# Patient Record
Sex: Male | Born: 1955
Health system: Southern US, Community
[De-identification: ages and names within clinical notes are randomized; demographics above are authoritative.]

## PROBLEM LIST (undated history)

## (undated) DIAGNOSIS — T7840XA Allergy, unspecified, initial encounter: Secondary | ICD-10-CM

## (undated) DIAGNOSIS — I10 Essential (primary) hypertension: Secondary | ICD-10-CM

## (undated) DIAGNOSIS — E785 Hyperlipidemia, unspecified: Secondary | ICD-10-CM

## (undated) DIAGNOSIS — E669 Obesity, unspecified: Secondary | ICD-10-CM

## (undated) HISTORY — DX: Hyperlipidemia, unspecified: E78.5

## (undated) HISTORY — DX: Essential (primary) hypertension: I10

## (undated) HISTORY — DX: Allergy, unspecified, initial encounter: T78.40XA

## (undated) HISTORY — DX: Obesity, unspecified: E66.9

---
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
## 1984-02-16 HISTORY — PX: CHOLECYSTECTOMY: SHX55

## 1984-02-16 HISTORY — PX: INGUINAL HERNIA REPAIR: SUR1180

## 1986-02-15 HISTORY — PX: INGUINAL HERNIA REPAIR: SUR1180

## 2001-08-14 ENCOUNTER — Encounter: Admission: RE | Admit: 2001-08-14 | Discharge: 2001-11-12 | Payer: Self-pay | Admitting: Family Medicine

## 2005-09-02 ENCOUNTER — Ambulatory Visit: Payer: Self-pay | Admitting: Family Medicine

## 2005-09-28 ENCOUNTER — Ambulatory Visit: Payer: Self-pay | Admitting: Gastroenterology

## 2005-11-18 ENCOUNTER — Ambulatory Visit: Payer: Self-pay | Admitting: Gastroenterology

## 2005-11-29 ENCOUNTER — Ambulatory Visit: Payer: Self-pay | Admitting: Family Medicine

## 2005-12-10 ENCOUNTER — Ambulatory Visit: Payer: Self-pay | Admitting: Family Medicine

## 2006-04-01 ENCOUNTER — Ambulatory Visit: Payer: Self-pay | Admitting: Family Medicine

## 2006-04-08 ENCOUNTER — Ambulatory Visit: Payer: Self-pay | Admitting: Family Medicine

## 2006-08-25 ENCOUNTER — Ambulatory Visit: Payer: Self-pay | Admitting: Family Medicine

## 2006-08-31 ENCOUNTER — Ambulatory Visit: Payer: Self-pay | Admitting: Family Medicine

## 2006-12-02 ENCOUNTER — Ambulatory Visit: Payer: Self-pay | Admitting: Family Medicine

## 2007-03-06 ENCOUNTER — Ambulatory Visit: Payer: Self-pay | Admitting: Family Medicine

## 2007-06-02 ENCOUNTER — Ambulatory Visit: Payer: Self-pay | Admitting: Family Medicine

## 2007-07-03 ENCOUNTER — Ambulatory Visit: Payer: Self-pay | Admitting: Family Medicine

## 2007-07-24 ENCOUNTER — Ambulatory Visit: Payer: Self-pay | Admitting: Family Medicine

## 2007-11-03 ENCOUNTER — Ambulatory Visit: Payer: Self-pay | Admitting: Family Medicine

## 2007-12-01 ENCOUNTER — Ambulatory Visit: Payer: Self-pay | Admitting: Family Medicine

## 2008-02-23 ENCOUNTER — Ambulatory Visit: Payer: Self-pay | Admitting: Family Medicine

## 2008-07-23 ENCOUNTER — Ambulatory Visit: Payer: Self-pay | Admitting: Family Medicine

## 2008-12-02 ENCOUNTER — Ambulatory Visit: Payer: Self-pay | Admitting: Family Medicine

## 2009-04-28 ENCOUNTER — Ambulatory Visit: Payer: Self-pay | Admitting: Family Medicine

## 2009-09-24 ENCOUNTER — Ambulatory Visit: Payer: Self-pay | Admitting: Family Medicine

## 2009-11-11 ENCOUNTER — Ambulatory Visit: Payer: Self-pay | Admitting: Family Medicine

## 2009-11-15 HISTORY — PX: EYE SURGERY: SHX253

## 2010-02-10 ENCOUNTER — Ambulatory Visit: Payer: Self-pay | Admitting: Family Medicine

## 2010-05-19 ENCOUNTER — Ambulatory Visit (INDEPENDENT_AMBULATORY_CARE_PROVIDER_SITE_OTHER): Payer: BC Managed Care – PPO | Admitting: Family Medicine

## 2010-05-19 DIAGNOSIS — E119 Type 2 diabetes mellitus without complications: Secondary | ICD-10-CM

## 2010-05-19 DIAGNOSIS — I10 Essential (primary) hypertension: Secondary | ICD-10-CM

## 2010-05-19 DIAGNOSIS — E785 Hyperlipidemia, unspecified: Secondary | ICD-10-CM

## 2010-08-04 ENCOUNTER — Other Ambulatory Visit: Payer: Self-pay | Admitting: Family Medicine

## 2010-09-17 ENCOUNTER — Encounter: Payer: Self-pay | Admitting: Family Medicine

## 2010-09-21 ENCOUNTER — Ambulatory Visit (INDEPENDENT_AMBULATORY_CARE_PROVIDER_SITE_OTHER): Payer: BC Managed Care – PPO | Admitting: Family Medicine

## 2010-09-21 ENCOUNTER — Encounter: Payer: Self-pay | Admitting: Family Medicine

## 2010-09-21 DIAGNOSIS — E785 Hyperlipidemia, unspecified: Secondary | ICD-10-CM

## 2010-09-21 DIAGNOSIS — I152 Hypertension secondary to endocrine disorders: Secondary | ICD-10-CM | POA: Insufficient documentation

## 2010-09-21 DIAGNOSIS — E1159 Type 2 diabetes mellitus with other circulatory complications: Secondary | ICD-10-CM | POA: Insufficient documentation

## 2010-09-21 DIAGNOSIS — E1169 Type 2 diabetes mellitus with other specified complication: Secondary | ICD-10-CM

## 2010-09-21 DIAGNOSIS — I10 Essential (primary) hypertension: Secondary | ICD-10-CM

## 2010-09-21 DIAGNOSIS — E118 Type 2 diabetes mellitus with unspecified complications: Secondary | ICD-10-CM | POA: Insufficient documentation

## 2010-09-21 DIAGNOSIS — E669 Obesity, unspecified: Secondary | ICD-10-CM

## 2010-09-21 DIAGNOSIS — E119 Type 2 diabetes mellitus without complications: Secondary | ICD-10-CM

## 2010-09-21 NOTE — Patient Instructions (Signed)
Standing her present medicines and make sure you followup with her ophthalmologist. See you in 4 months.

## 2010-09-21 NOTE — Progress Notes (Signed)
  Subjective:    Patient ID: Paul Long, male    DOB: [birth year], 55 y.o.   MRN: [id number]  HPI He is here for recheck. He continues on medications listed in the chart. He checks his sugars, exercises. He does not smoke or drink. He did have cataract surgery and is scheduled to see his ophthalmologist within the next several months. He does check his feet regularly. Work continues to go well. Home life is unchanged   Review of Systems     Objective:   Physical Exam Dirt and in no distress. Hemoglobin A1c 6.4       Assessment & Plan:  Diabetes. Hypertension. Hyperlipidemia. Obesity Continue present medication regimen. Followup with ophthalmologist

## 2010-10-01 ENCOUNTER — Other Ambulatory Visit: Payer: Self-pay | Admitting: Family Medicine

## 2010-10-08 ENCOUNTER — Other Ambulatory Visit: Payer: Self-pay | Admitting: Family Medicine

## 2010-11-12 ENCOUNTER — Other Ambulatory Visit: Payer: Self-pay | Admitting: Family Medicine

## 2010-11-16 HISTORY — PX: EYE SURGERY: SHX253

## 2010-11-27 ENCOUNTER — Other Ambulatory Visit: Payer: Self-pay | Admitting: Family Medicine

## 2010-11-30 ENCOUNTER — Other Ambulatory Visit (INDEPENDENT_AMBULATORY_CARE_PROVIDER_SITE_OTHER): Payer: BC Managed Care – PPO

## 2010-11-30 DIAGNOSIS — Z23 Encounter for immunization: Secondary | ICD-10-CM

## 2011-02-11 ENCOUNTER — Ambulatory Visit (INDEPENDENT_AMBULATORY_CARE_PROVIDER_SITE_OTHER): Payer: BC Managed Care – PPO | Admitting: Family Medicine

## 2011-02-11 ENCOUNTER — Encounter: Payer: Self-pay | Admitting: Family Medicine

## 2011-02-11 VITALS — BP 110/70 | HR 95 | Wt 221.0 lb

## 2011-02-11 DIAGNOSIS — E785 Hyperlipidemia, unspecified: Secondary | ICD-10-CM

## 2011-02-11 DIAGNOSIS — I10 Essential (primary) hypertension: Secondary | ICD-10-CM

## 2011-02-11 DIAGNOSIS — E669 Obesity, unspecified: Secondary | ICD-10-CM

## 2011-02-11 DIAGNOSIS — E1169 Type 2 diabetes mellitus with other specified complication: Secondary | ICD-10-CM

## 2011-02-11 DIAGNOSIS — Z79899 Other long term (current) drug therapy: Secondary | ICD-10-CM

## 2011-02-11 DIAGNOSIS — E1159 Type 2 diabetes mellitus with other circulatory complications: Secondary | ICD-10-CM

## 2011-02-11 DIAGNOSIS — E119 Type 2 diabetes mellitus without complications: Secondary | ICD-10-CM

## 2011-02-11 DIAGNOSIS — I152 Hypertension secondary to endocrine disorders: Secondary | ICD-10-CM

## 2011-02-11 LAB — POCT UA - MICROALBUMIN
Albumin/Creatinine Ratio, Urine, POC: 3.7
Microalbumin Ur, POC: 6.8 mg/dL

## 2011-02-11 LAB — COMPREHENSIVE METABOLIC PANEL
Albumin: 4.8 g/dL (ref 3.5–5.2)
Alkaline Phosphatase: 71 U/L (ref 39–117)
CO2: 25 mEq/L (ref 19–32)
Calcium: 9.5 mg/dL (ref 8.4–10.5)
Chloride: 103 mEq/L (ref 96–112)
Glucose, Bld: 145 mg/dL — ABNORMAL HIGH (ref 70–99)
Potassium: 4.6 mEq/L (ref 3.5–5.3)
Sodium: 137 mEq/L (ref 135–145)
Total Protein: 7.2 g/dL (ref 6.0–8.3)

## 2011-02-11 LAB — CBC WITH DIFFERENTIAL/PLATELET
Basophils Relative: 1 % (ref 0–1)
HCT: 44.8 % (ref 39.0–52.0)
Hemoglobin: 14.9 g/dL (ref 13.0–17.0)
Lymphocytes Relative: 32 % (ref 12–46)
Lymphs Abs: 2.4 10*3/uL (ref 0.7–4.0)
Monocytes Relative: 6 % (ref 3–12)
Neutro Abs: 4.4 10*3/uL (ref 1.7–7.7)
Neutrophils Relative %: 58 % (ref 43–77)
RBC: 4.63 MIL/uL (ref 4.22–5.81)
WBC: 7.6 10*3/uL (ref 4.0–10.5)

## 2011-02-11 LAB — LIPID PANEL
LDL Cholesterol: 59 mg/dL (ref 0–99)
Triglycerides: 136 mg/dL (ref <150)

## 2011-02-11 LAB — POCT GLYCOSYLATED HEMOGLOBIN (HGB A1C): Hemoglobin A1C: 6.9

## 2011-02-11 NOTE — Progress Notes (Signed)
  Subjective:    Patient ID: Paul Long, male    DOB: [birth year], 55 y.o.   MRN: [id number]  HPI He is here for a diabetes recheck. He does intermittently check his blood sugars. He walks 4 days per week. Continues on medications listed in the chart. He had an eye exam in August. His feet regularly. He does not smoke or drink. His work and home life are going well. He admits to overeating over the holidays.   Review of Systems     Objective:   Physical Exam Alert and in no distress. Hemoglobin A1c 6.9       Assessment & Plan:   1. Type II or unspecified type diabetes mellitus without mention of complication, not stated as uncontrolled  POCT HgB A1C, CBC with Differential, Comprehensive metabolic panel, Lipid panel, POCT UA - Microalbumin  2. Hyperlipidemia LDL goal <70  Lipid panel  3. Hypertension associated with diabetes  CBC with Differential, Comprehensive metabolic panel, Lipid panel  4. Obesity (BMI 30-39.9)    5. Encounter for long-term (current) use of other medications  CBC with Differential, Comprehensive metabolic panel, Lipid panel, POCT UA - Microalbumin   Encouraged him to continue with his physical activities and make some dietary changes. Recheck here 4 months.

## 2011-02-15 ENCOUNTER — Ambulatory Visit: Payer: BC Managed Care – PPO | Admitting: Family Medicine

## 2011-02-25 ENCOUNTER — Other Ambulatory Visit: Payer: Self-pay | Admitting: Family Medicine

## 2011-04-19 ENCOUNTER — Telehealth: Payer: Self-pay | Admitting: Internal Medicine

## 2011-04-19 MED ORDER — ATORVASTATIN CALCIUM 20 MG PO TABS: 20.0000 mg | ORAL_TABLET | Freq: Every day | ORAL | Status: DC

## 2011-04-19 NOTE — Telephone Encounter (Signed)
Sent med in 

## 2011-05-24 ENCOUNTER — Ambulatory Visit: Payer: BC Managed Care – PPO | Admitting: Family Medicine

## 2011-06-04 ENCOUNTER — Encounter: Payer: Self-pay | Admitting: Family Medicine

## 2011-06-04 ENCOUNTER — Ambulatory Visit (INDEPENDENT_AMBULATORY_CARE_PROVIDER_SITE_OTHER): Payer: BC Managed Care – PPO | Admitting: Family Medicine

## 2011-06-04 DIAGNOSIS — E785 Hyperlipidemia, unspecified: Secondary | ICD-10-CM

## 2011-06-04 DIAGNOSIS — I152 Hypertension secondary to endocrine disorders: Secondary | ICD-10-CM

## 2011-06-04 DIAGNOSIS — I10 Essential (primary) hypertension: Secondary | ICD-10-CM

## 2011-06-04 DIAGNOSIS — E1169 Type 2 diabetes mellitus with other specified complication: Secondary | ICD-10-CM

## 2011-06-04 DIAGNOSIS — E1159 Type 2 diabetes mellitus with other circulatory complications: Secondary | ICD-10-CM

## 2011-06-04 DIAGNOSIS — E669 Obesity, unspecified: Secondary | ICD-10-CM

## 2011-06-04 DIAGNOSIS — E119 Type 2 diabetes mellitus without complications: Secondary | ICD-10-CM

## 2011-06-04 LAB — POCT GLYCOSYLATED HEMOGLOBIN (HGB A1C): Hemoglobin A1C: 7.2

## 2011-06-04 NOTE — Patient Instructions (Signed)
Keep taking good care of yourself 

## 2011-06-04 NOTE — Progress Notes (Signed)
  Subjective:    Patient ID: Paul Long, male    DOB: [birth year], 56 y.o.   MRN: [id number]  HPI He is here for recheck. He continues on medications listed in the chart. He does walk daily. He does check his blood sugars usually once per week. He continues on medications listed in the chart. Does check his feet periodically. He has had an eye exam. He does not smoke or drink. Work and home life are going well the   Review of Systems     Objective:   Physical Exam  alert and in no distress. Blood A1c 7.2. Foot exam done.       Assessment & Plan:   1. Diabetes mellitus  POCT HgB A1C  2. Hypertension associated with diabetes    3. Hyperlipidemia LDL goal <70    4. Obesity (BMI 30-39.9)

## 2011-06-15 ENCOUNTER — Other Ambulatory Visit: Payer: Self-pay | Admitting: Family Medicine

## 2011-06-27 ENCOUNTER — Other Ambulatory Visit: Payer: Self-pay | Admitting: Family Medicine

## 2011-06-29 ENCOUNTER — Other Ambulatory Visit: Payer: Self-pay | Admitting: Family Medicine

## 2011-09-01 ENCOUNTER — Other Ambulatory Visit: Payer: Self-pay | Admitting: Family Medicine

## 2011-09-20 ENCOUNTER — Encounter: Payer: Self-pay | Admitting: Internal Medicine

## 2011-09-22 ENCOUNTER — Other Ambulatory Visit: Payer: Self-pay | Admitting: Family Medicine

## 2011-09-22 ENCOUNTER — Encounter: Payer: Self-pay | Admitting: Family Medicine

## 2011-09-22 ENCOUNTER — Ambulatory Visit (INDEPENDENT_AMBULATORY_CARE_PROVIDER_SITE_OTHER): Payer: BC Managed Care – PPO | Admitting: Family Medicine

## 2011-09-22 VITALS — BP 116/70 | HR 92 | Wt 222.0 lb

## 2011-09-22 DIAGNOSIS — M109 Gout, unspecified: Secondary | ICD-10-CM | POA: Insufficient documentation

## 2011-09-22 DIAGNOSIS — I152 Hypertension secondary to endocrine disorders: Secondary | ICD-10-CM

## 2011-09-22 DIAGNOSIS — E119 Type 2 diabetes mellitus without complications: Secondary | ICD-10-CM

## 2011-09-22 DIAGNOSIS — E1169 Type 2 diabetes mellitus with other specified complication: Secondary | ICD-10-CM

## 2011-09-22 DIAGNOSIS — I10 Essential (primary) hypertension: Secondary | ICD-10-CM

## 2011-09-22 DIAGNOSIS — E1159 Type 2 diabetes mellitus with other circulatory complications: Secondary | ICD-10-CM

## 2011-09-22 DIAGNOSIS — E785 Hyperlipidemia, unspecified: Secondary | ICD-10-CM

## 2011-09-22 DIAGNOSIS — E669 Obesity, unspecified: Secondary | ICD-10-CM

## 2011-09-22 LAB — POCT GLYCOSYLATED HEMOGLOBIN (HGB A1C): Hemoglobin A1C: 7

## 2011-09-22 NOTE — Progress Notes (Signed)
  Subjective:    Patient ID: Paul Long, male    DOB: 06/28/1955, 57 y.o.   MRN: [id number]  HPI He is here for medication check. He continues on medications listed in the chart. He has had no more gout attacks. He does check his blood sugars intermittently and exercises regularly. He does not smoke or drink. He has had no sexual dysfunction or bladder function issues. His eye exam was Monday. He does check his feet regularly.   Review of Systems     Objective:   Physical Exam Alert and in no distress. Hemoglobin A1c is 7.0. Blood work was reviewed.      Assessment & Plan:   1. Diabetes mellitus  POCT glycosylated hemoglobin (Hb A1C)  2. Hyperlipidemia LDL goal <70    3. Hypertension associated with diabetes    4. Obesity (BMI 30-39.9)    5. Gout     he is to return here in 4 months. I will check hepatitis B and C. at that time. Pneumovax given.

## 2011-09-30 ENCOUNTER — Ambulatory Visit (INDEPENDENT_AMBULATORY_CARE_PROVIDER_SITE_OTHER): Payer: BC Managed Care – PPO | Admitting: Family Medicine

## 2011-09-30 ENCOUNTER — Encounter: Payer: Self-pay | Admitting: Family Medicine

## 2011-09-30 VITALS — BP 120/82 | HR 133 | Wt 225.0 lb

## 2011-09-30 DIAGNOSIS — M549 Dorsalgia, unspecified: Secondary | ICD-10-CM

## 2011-09-30 DIAGNOSIS — M546 Pain in thoracic spine: Secondary | ICD-10-CM

## 2011-09-30 NOTE — Progress Notes (Signed)
  Subjective:    Patient ID: Paul Long, male    DOB: April 06, 1955, 56 y.o.   MRN: [id number]  HPI He complains of a one-week history of left upper back pain. No history of injury. He gets relief when he puts his arm on the back of a chair. No numbness, tingling or weakness.   Review of Systems     Objective:   Physical Exam No tenderness to palpation of the neck, upper back or shoulder area. Full range of motion of the shoulder . No laxity. Negative drop arm testing. Normal strength.       Assessment & Plan:   1. Upper back pain on left side    recommend conservative care. If continued difficulty he is toturn here for followup.

## 2011-09-30 NOTE — Patient Instructions (Addendum)
Heat for 20 minutes 3 times per day. Tylenol, Advil or Aleve. Stretch your neck and shoulder. Also proper posturing when sitting. Sit like your mom told you!

## 2011-11-01 ENCOUNTER — Other Ambulatory Visit: Payer: Self-pay | Admitting: Family Medicine

## 2011-12-03 ENCOUNTER — Other Ambulatory Visit (INDEPENDENT_AMBULATORY_CARE_PROVIDER_SITE_OTHER): Payer: BC Managed Care – PPO

## 2011-12-03 DIAGNOSIS — Z23 Encounter for immunization: Secondary | ICD-10-CM

## 2012-01-04 ENCOUNTER — Other Ambulatory Visit: Payer: Self-pay | Admitting: Family Medicine

## 2012-02-07 ENCOUNTER — Encounter: Payer: Self-pay | Admitting: Family Medicine

## 2012-02-07 ENCOUNTER — Ambulatory Visit (INDEPENDENT_AMBULATORY_CARE_PROVIDER_SITE_OTHER): Payer: BC Managed Care – PPO | Admitting: Family Medicine

## 2012-02-07 VITALS — BP 114/70 | HR 92 | Wt 224.0 lb

## 2012-02-07 DIAGNOSIS — I10 Essential (primary) hypertension: Secondary | ICD-10-CM

## 2012-02-07 DIAGNOSIS — E119 Type 2 diabetes mellitus without complications: Secondary | ICD-10-CM

## 2012-02-07 DIAGNOSIS — E1169 Type 2 diabetes mellitus with other specified complication: Secondary | ICD-10-CM

## 2012-02-07 DIAGNOSIS — E669 Obesity, unspecified: Secondary | ICD-10-CM

## 2012-02-07 DIAGNOSIS — I152 Hypertension secondary to endocrine disorders: Secondary | ICD-10-CM

## 2012-02-07 DIAGNOSIS — E785 Hyperlipidemia, unspecified: Secondary | ICD-10-CM

## 2012-02-07 DIAGNOSIS — Z79899 Other long term (current) drug therapy: Secondary | ICD-10-CM

## 2012-02-07 DIAGNOSIS — M109 Gout, unspecified: Secondary | ICD-10-CM

## 2012-02-07 LAB — CBC WITH DIFFERENTIAL/PLATELET
Eosinophils Absolute: 0.2 10*3/uL (ref 0.0–0.7)
Eosinophils Relative: 3 % (ref 0–5)
HCT: 44.2 % (ref 39.0–52.0)
Hemoglobin: 15 g/dL (ref 13.0–17.0)
Lymphs Abs: 2.4 10*3/uL (ref 0.7–4.0)
MCH: 32 pg (ref 26.0–34.0)
MCV: 94.2 fL (ref 78.0–100.0)
Monocytes Relative: 6 % (ref 3–12)
RBC: 4.69 MIL/uL (ref 4.22–5.81)

## 2012-02-07 LAB — COMPREHENSIVE METABOLIC PANEL
CO2: 26 mEq/L (ref 19–32)
Creat: 0.85 mg/dL (ref 0.50–1.35)
Glucose, Bld: 142 mg/dL — ABNORMAL HIGH (ref 70–99)
Sodium: 139 mEq/L (ref 135–145)
Total Bilirubin: 1 mg/dL (ref 0.3–1.2)
Total Protein: 7.1 g/dL (ref 6.0–8.3)

## 2012-02-07 LAB — LIPID PANEL: Cholesterol: 118 mg/dL (ref 0–200)

## 2012-02-07 LAB — URIC ACID: Uric Acid, Serum: 4.7 mg/dL (ref 4.0–7.8)

## 2012-02-07 NOTE — Progress Notes (Signed)
  Subjective:    Patient ID: Paul Long, male    DOB: August 30, 1955, 56 y.o.   MRN: [id number]  HPI He is here for a recheck. He continues on medications listed in the chart. His exercise is erratic. He does check his sugars but does them randomly and also before meals. Smoking and drinking were reviewed. He's had no difficulty with his gout. His work and marriage are unchanged.   Review of Systems     Objective:   Physical Exam Alert and in no distress. Foot exam done and was normal. Hemoglobin A1c is 6.9.      Assessment & Plan:   1. Diabetes mellitus  POCT glycosylated hemoglobin (Hb A1C), Lipid panel, CBC with Differential, Comprehensive metabolic panel, POCT UA - Microalbumin  2. Hypertension associated with diabetes    3. Hyperlipidemia LDL goal <70  Lipid panel  4. Obesity (BMI 30-39.9)  CBC with Differential, Comprehensive metabolic panel  5. Gout  Uric Acid  6. Encounter for long-term (current) use of other medications     encouraged him to check his blood sugars 2 hours after meals. Also discussed the need for him to make diet and exercise changes to help get rid of his central abdominal fat.

## 2012-02-07 NOTE — Patient Instructions (Signed)
Check your blood sugars before a meal or 2 hours after a meal.

## 2012-02-10 NOTE — Progress Notes (Signed)
Quick Note:  CALLED PT HOME # PT INFORMED LABS LOOK GOOD CONTINUE PT VERBALIZED UNDERSTANDING  ______

## 2012-02-11 ENCOUNTER — Other Ambulatory Visit: Payer: Self-pay | Admitting: Family Medicine

## 2012-03-05 ENCOUNTER — Other Ambulatory Visit: Payer: Self-pay | Admitting: Family Medicine

## 2012-05-02 ENCOUNTER — Other Ambulatory Visit: Payer: Self-pay | Admitting: Family Medicine

## 2012-06-12 ENCOUNTER — Ambulatory Visit (INDEPENDENT_AMBULATORY_CARE_PROVIDER_SITE_OTHER): Payer: BC Managed Care – PPO | Admitting: Family Medicine

## 2012-06-12 ENCOUNTER — Encounter: Payer: Self-pay | Admitting: Family Medicine

## 2012-06-12 VITALS — BP 114/70 | HR 70 | Wt 221.0 lb

## 2012-06-12 DIAGNOSIS — E669 Obesity, unspecified: Secondary | ICD-10-CM

## 2012-06-12 DIAGNOSIS — E1169 Type 2 diabetes mellitus with other specified complication: Secondary | ICD-10-CM

## 2012-06-12 DIAGNOSIS — I10 Essential (primary) hypertension: Secondary | ICD-10-CM

## 2012-06-12 DIAGNOSIS — E1159 Type 2 diabetes mellitus with other circulatory complications: Secondary | ICD-10-CM

## 2012-06-12 DIAGNOSIS — I152 Hypertension secondary to endocrine disorders: Secondary | ICD-10-CM

## 2012-06-12 DIAGNOSIS — E119 Type 2 diabetes mellitus without complications: Secondary | ICD-10-CM

## 2012-06-12 DIAGNOSIS — E785 Hyperlipidemia, unspecified: Secondary | ICD-10-CM

## 2012-06-12 LAB — POCT GLYCOSYLATED HEMOGLOBIN (HGB A1C): Hemoglobin A1C: 7.3

## 2012-06-12 NOTE — Progress Notes (Signed)
  Subjective:    Paul Long is a 57 y.o. male who presents for follow-up of Type 2 diabetes mellitus.    Home blood sugar records: 120 TO 130  Current symptoms/problems include  and haveNONE   been unchanged. Daily foot checks, foot concerns: 02/07/2012 DR.Jimmi Sidener NONE Last eye exam:  09/2011   Medication compliance: Current diet: NONE Current exercise: WALK 1 HR 7 DAYS A WEEK Known diabetic complications: none Cardiovascular risk factors: advanced age (older than 70 for men, 64 for women), diabetes mellitus, dyslipidemia, hypertension, male gender and obesity (BMI >= 30 kg/m2)   The following portions of the patient's history were reviewed and updated as appropriate: allergies, current medications, past family history, past medical history, past social history, past surgical history and problem list.  ROS as in subjective above    Objective:    BP 114/70  Pulse 70  Wt 221 lb (100.245 kg)  BMI 31.71 kg/m2  Filed Vitals:   06/12/12 0812  BP: 114/70  Pulse: 70    General appearence: alert, no distress, WD/WN Neck: supple, no lymphadenopathy, no thyromegaly, no masses Heart: RRR, normal S1, S2, no murmurs Lungs: CTA bilaterally, no wheezes, rhonchi, or rales Abdomen: +bs, soft, non tender, non distended, no masses, no hepatomegaly, no splenomegaly Pulses: 2+ symmetric, upper and lower extremities, normal cap refill Ext: no edema Foot exam:  Neuro: foot monofilament exam normal   Lab Review Lab Results  Component Value Date   HGBA1C 6.9 02/07/2012   Lab Results  Component Value Date   CHOL 118 02/07/2012   HDL 44 02/07/2012   LDLCALC 47 02/07/2012   TRIG 134 02/07/2012   CHOLHDL 2.7 02/07/2012   No results found for this basenameConcepcion Elk     Chemistry      Component Value Date/Time   NA 139 02/07/2012 0855   K 4.3 02/07/2012 0855   CL 103 02/07/2012 0855   CO2 26 02/07/2012 0855   BUN 13 02/07/2012 0855   CREATININE 0.85 02/07/2012  0855      Component Value Date/Time   CALCIUM 10.0 02/07/2012 0855   ALKPHOS 73 02/07/2012 0855   AST 27 02/07/2012 0855   ALT 36 02/07/2012 0855   BILITOT 1.0 02/07/2012 0855        Chemistry      Component Value Date/Time   NA 139 02/07/2012 0855   K 4.3 02/07/2012 0855   CL 103 02/07/2012 0855   CO2 26 02/07/2012 0855   BUN 13 02/07/2012 0855   CREATININE 0.85 02/07/2012 0855      Component Value Date/Time   CALCIUM 10.0 02/07/2012 0855   ALKPHOS 73 02/07/2012 0855   AST 27 02/07/2012 0855   ALT 36 02/07/2012 0855   BILITOT 1.0 02/07/2012 0855       Last optometry/ophthalmology exam reviewed from:09/2011 [place] [facility]    Assessment:   Encounter Diagnoses  Name Primary?  . Diabetes mellitus Yes  . Hypertension associated with diabetes   . Hyperlipidemia LDL goal <70   . Obesity (BMI 30-39.9)          Plan:    1.  Rx changes: NONE 2.  Education: Reviewed 'ABCs' of diabetes management (respective goals in parentheses):  A1C (<7), blood pressure (<130/80), and cholesterol (LDL <100). 3.  Compliance at present is estimated to be fair. Efforts to improve compliance (if necessary) will be directed at dietary modifications: Look at your carbohydrates. 4. Follow up: 4 months

## 2012-06-12 NOTE — Patient Instructions (Signed)
Keep up which are doing but look and see if you can adjust your diet. Make sure you check your sugars 2 hours after meals

## 2012-07-04 ENCOUNTER — Other Ambulatory Visit: Payer: Self-pay | Admitting: Family Medicine

## 2012-07-08 ENCOUNTER — Other Ambulatory Visit: Payer: Self-pay | Admitting: Family Medicine

## 2012-09-11 ENCOUNTER — Other Ambulatory Visit: Payer: Self-pay | Admitting: Family Medicine

## 2012-09-25 ENCOUNTER — Encounter: Payer: Self-pay | Admitting: Family Medicine

## 2012-09-25 ENCOUNTER — Ambulatory Visit (INDEPENDENT_AMBULATORY_CARE_PROVIDER_SITE_OTHER): Payer: BC Managed Care – PPO | Admitting: Family Medicine

## 2012-09-25 VITALS — BP 118/70 | HR 113 | Wt 221.0 lb

## 2012-09-25 DIAGNOSIS — E785 Hyperlipidemia, unspecified: Secondary | ICD-10-CM

## 2012-09-25 DIAGNOSIS — E669 Obesity, unspecified: Secondary | ICD-10-CM

## 2012-09-25 DIAGNOSIS — E1169 Type 2 diabetes mellitus with other specified complication: Secondary | ICD-10-CM

## 2012-09-25 DIAGNOSIS — I152 Hypertension secondary to endocrine disorders: Secondary | ICD-10-CM

## 2012-09-25 DIAGNOSIS — I10 Essential (primary) hypertension: Secondary | ICD-10-CM

## 2012-09-25 DIAGNOSIS — E1159 Type 2 diabetes mellitus with other circulatory complications: Secondary | ICD-10-CM

## 2012-09-25 DIAGNOSIS — E119 Type 2 diabetes mellitus without complications: Secondary | ICD-10-CM

## 2012-09-25 LAB — POCT UA - MICROALBUMIN: Albumin/Creatinine Ratio, Urine, POC: 38.7

## 2012-09-25 NOTE — Progress Notes (Signed)
  Subjective:    Paul Long is a 57 y.o. male who presents for follow-up of Type 2 diabetes mellitus.    Home blood sugar records: high 150 low 90  Current symptoms/ none Daily foot checks: yes/none Last eye exam:  09/25/12 Dr.[email or web address]   Medication compliance: Current diet: none Current exercise: walking 30 min. daily Known diabetic complications: none Cardiovascular risk factors: advanced age (older than 29 for men, 43 for women), diabetes mellitus, dyslipidemia, hypertension, male gender and obesity (BMI >= 30 kg/m2)   The following portions of the patient's history were reviewed and updated as appropriate: allergies, current medications, past medical history, past social history and problem list.  ROS as in subjective above    Objective:    General appearence: alert, no distress, WD/WN Ext: no edema Foot exam:  Neuro: foot monofilament exam normal;2+pulses   Lab Review Lab Results  Component Value Date   HGBA1C 7.3 06/12/2012   Lab Results  Component Value Date   CHOL 118 02/07/2012   HDL 44 02/07/2012   LDLCALC 47 02/07/2012   TRIG 134 02/07/2012   CHOLHDL 2.7 02/07/2012   No results found for this basenameConcepcion Elk     Chemistry      Component Value Date/Time   NA 139 02/07/2012 0855   K 4.3 02/07/2012 0855   CL 103 02/07/2012 0855   CO2 26 02/07/2012 0855   BUN 13 02/07/2012 0855   CREATININE 0.85 02/07/2012 0855      Component Value Date/Time   CALCIUM 10.0 02/07/2012 0855   ALKPHOS 73 02/07/2012 0855   AST 27 02/07/2012 0855   ALT 36 02/07/2012 0855   BILITOT 1.0 02/07/2012 0855        Chemistry      Component Value Date/Time   NA 139 02/07/2012 0855   K 4.3 02/07/2012 0855   CL 103 02/07/2012 0855   CO2 26 02/07/2012 0855   BUN 13 02/07/2012 0855   CREATININE 0.85 02/07/2012 0855      Component Value Date/Time   CALCIUM 10.0 02/07/2012 0855   ALKPHOS 73 02/07/2012 0855   AST 27 02/07/2012 0855   ALT 36 02/07/2012  0855   BILITOT 1.0 02/07/2012 0855      HbA1C 6.6  Assessment:   Encounter Diagnoses  Name Primary?  . Diabetes mellitus Yes  . Hyperlipidemia LDL goal <70   . Hypertension associated with diabetes   . Obesity (BMI 30-39.9)          Plan:    1.  Rx changes: none 2.  Education: Reviewed 'ABCs' of diabetes management (respective goals in parentheses):  A1C (<7), blood pressure (<130/80), and cholesterol (LDL <100). 3.  Compliance at present is estimated to be excellent. Efforts to improve compliance (if necessary) will be directed at none. 4. Follow up: 4 months

## 2012-09-26 ENCOUNTER — Encounter: Payer: Self-pay | Admitting: Internal Medicine

## 2012-10-29 ENCOUNTER — Other Ambulatory Visit: Payer: Self-pay | Admitting: Family Medicine

## 2012-10-31 ENCOUNTER — Other Ambulatory Visit: Payer: Self-pay | Admitting: Family Medicine

## 2012-11-30 ENCOUNTER — Other Ambulatory Visit: Payer: Self-pay | Admitting: Family Medicine

## 2012-12-08 ENCOUNTER — Other Ambulatory Visit (INDEPENDENT_AMBULATORY_CARE_PROVIDER_SITE_OTHER): Payer: BC Managed Care – PPO

## 2012-12-08 DIAGNOSIS — Z23 Encounter for immunization: Secondary | ICD-10-CM

## 2013-01-01 ENCOUNTER — Other Ambulatory Visit: Payer: Self-pay | Admitting: Family Medicine

## 2013-01-03 ENCOUNTER — Other Ambulatory Visit: Payer: Self-pay | Admitting: Family Medicine

## 2013-02-20 ENCOUNTER — Ambulatory Visit (INDEPENDENT_AMBULATORY_CARE_PROVIDER_SITE_OTHER): Payer: BC Managed Care – PPO | Admitting: Family Medicine

## 2013-02-20 ENCOUNTER — Encounter: Payer: Self-pay | Admitting: Family Medicine

## 2013-02-20 VITALS — BP 114/82 | HR 108 | Wt 217.0 lb

## 2013-02-20 DIAGNOSIS — M109 Gout, unspecified: Secondary | ICD-10-CM

## 2013-02-20 DIAGNOSIS — E119 Type 2 diabetes mellitus without complications: Secondary | ICD-10-CM

## 2013-02-20 DIAGNOSIS — I10 Essential (primary) hypertension: Secondary | ICD-10-CM

## 2013-02-20 DIAGNOSIS — E1169 Type 2 diabetes mellitus with other specified complication: Secondary | ICD-10-CM

## 2013-02-20 DIAGNOSIS — E785 Hyperlipidemia, unspecified: Secondary | ICD-10-CM

## 2013-02-20 DIAGNOSIS — E669 Obesity, unspecified: Secondary | ICD-10-CM

## 2013-02-20 DIAGNOSIS — I152 Hypertension secondary to endocrine disorders: Secondary | ICD-10-CM

## 2013-02-20 DIAGNOSIS — E1159 Type 2 diabetes mellitus with other circulatory complications: Secondary | ICD-10-CM

## 2013-02-20 LAB — POCT GLYCOSYLATED HEMOGLOBIN (HGB A1C): Hemoglobin A1C: 6.8

## 2013-02-20 NOTE — Progress Notes (Signed)
   Subjective:    Patient ID: Paul Long, male    DOB: [birth year], 58 y.o.   MRN: [id number]  HPI He is here for a diabetes check. He continues to check his blood sugars and they run between 90 and 150. He continues to check his feet regularly. He walks 30 minutes per day. He continues on medications listed in the chart. Doesn't smoke or drink. No gout attacks. He did have URI symptoms in early December and still has a slight cough. No fever, chills, sore throat or earache. Blood work from December was reviewed. He has had his flu shot.   Review of Systems     Objective:   Physical Exam alert and in no distress. Tympanic membranes and canals are normal. Throat is clear. Tonsils are normal. Neck is supple without adenopathy or thyromegaly. Cardiac exam shows a regular sinus rhythm without murmurs or gallops. Lungs are clear to auscultation.  Hemoglobin A1c is 6.8.      Assessment & Plan:  Type II or unspecified type diabetes mellitus without mention of complication, not stated as uncontrolled - Plan: HgB A1c  Diabetes mellitus  Gout  Obesity (BMI 30-39.9)  Hypertension associated with diabetes  Hyperlipidemia LDL goal <70  encouraged him to continue with his present medication regimen. Return here in 4 months.

## 2013-04-27 ENCOUNTER — Other Ambulatory Visit: Payer: Self-pay | Admitting: Family Medicine

## 2013-04-27 NOTE — Telephone Encounter (Signed)
Is this ok to refill?  

## 2013-05-21 ENCOUNTER — Telehealth: Payer: Self-pay | Admitting: Family Medicine

## 2013-05-21 MED ORDER — GLUCOSE BLOOD VI STRP: ORAL_STRIP | Status: DC

## 2013-05-21 NOTE — Telephone Encounter (Signed)
Medication sent in. 

## 2013-05-21 NOTE — Telephone Encounter (Signed)
Needs test strips  One touch ultra  90 day supply   [phone]   ([employer] )  Option 1, and option 1

## 2013-05-21 NOTE — Telephone Encounter (Signed)
Give him a one year supply

## 2013-05-23 ENCOUNTER — Telehealth: Payer: Self-pay | Admitting: Family Medicine

## 2013-05-31 NOTE — Telephone Encounter (Signed)
Explained to pt to call ins company about meter & strips

## 2013-06-21 ENCOUNTER — Encounter: Payer: Self-pay | Admitting: Family Medicine

## 2013-06-21 ENCOUNTER — Ambulatory Visit (INDEPENDENT_AMBULATORY_CARE_PROVIDER_SITE_OTHER): Payer: BC Managed Care – PPO | Admitting: Family Medicine

## 2013-06-21 VITALS — BP 110/72 | HR 88 | Wt 224.0 lb

## 2013-06-21 DIAGNOSIS — E1159 Type 2 diabetes mellitus with other circulatory complications: Secondary | ICD-10-CM

## 2013-06-21 DIAGNOSIS — I152 Hypertension secondary to endocrine disorders: Secondary | ICD-10-CM

## 2013-06-21 DIAGNOSIS — E669 Obesity, unspecified: Secondary | ICD-10-CM

## 2013-06-21 DIAGNOSIS — E119 Type 2 diabetes mellitus without complications: Secondary | ICD-10-CM

## 2013-06-21 DIAGNOSIS — E785 Hyperlipidemia, unspecified: Secondary | ICD-10-CM

## 2013-06-21 DIAGNOSIS — I10 Essential (primary) hypertension: Secondary | ICD-10-CM

## 2013-06-21 DIAGNOSIS — E1169 Type 2 diabetes mellitus with other specified complication: Secondary | ICD-10-CM

## 2013-06-21 LAB — POCT GLYCOSYLATED HEMOGLOBIN (HGB A1C): Hemoglobin A1C: 7.2

## 2013-06-21 NOTE — Progress Notes (Signed)
   Subjective:    Patient ID: Paul Long, male    DOB: 05/23/1955, 58 y.o.   MRN: [id number]  Paul Long is a very pleasant 58 y.o. yo male who  has a past medical history of Allergy; Obesity; Gout; Dyslipidemia; Diabetes mellitus; and Hypertension. He presents today for a four month diabetes follow-up.  HPI  The patient is doing well overall and has no acute complaints today. The patient checks his blood sugars 2 hours after eating normally however has been infrequent since running out of test strips. They are generally around 125-140.   In terms of diet, the patient reports worsening of his diet of late with associated weight gain and he also reports 30 minutes of walking a day.   The patient smokes 0ppd, and drinks alcohol rarely. The patient reports no new lesions, burning or tingling in his feet or legs, and had his most recent eye check was in August of 2014.   Review of Systems is negative except per HPI.    Objective:   Physical Exam  Constitutional: Patient is oriented to person, place, and time and well-developed, well-nourished, and in no distress. Eyes: Conjunctivae and EOM are normal. Pupils are equal, round, and reactive to light.  Cardiovascular: Normal rate, regular rhythm and intact distal pulses. Pulmonary/Chest: Effort normal and breath sounds normal.  Neurological: Monofilament sensation normal in feet bilaterally.  HgA1c: 7.2 Last lipid panel 2013  Assessment & Plan:  Diabetes mellitus - Plan: HgB A1c  Obesity (BMI 30-39.9)  Hypertension associated with diabetes  Hyperlipidemia LDL goal <70  he is to continue on his present medication regimen but we'll more time and energy into diet and exercise.

## 2013-07-01 ENCOUNTER — Other Ambulatory Visit: Payer: Self-pay | Admitting: Family Medicine

## 2013-08-07 ENCOUNTER — Other Ambulatory Visit: Payer: Self-pay | Admitting: Family Medicine

## 2013-08-08 ENCOUNTER — Other Ambulatory Visit: Payer: Self-pay | Admitting: Family Medicine

## 2013-08-10 ENCOUNTER — Other Ambulatory Visit: Payer: Self-pay | Admitting: Family Medicine

## 2013-09-02 ENCOUNTER — Other Ambulatory Visit: Payer: Self-pay | Admitting: Family Medicine

## 2013-09-05 ENCOUNTER — Other Ambulatory Visit: Payer: Self-pay | Admitting: Family Medicine

## 2013-10-03 ENCOUNTER — Other Ambulatory Visit: Payer: Self-pay | Admitting: Family Medicine

## 2013-10-04 ENCOUNTER — Other Ambulatory Visit: Payer: Self-pay | Admitting: Family Medicine

## 2013-10-15 LAB — HM DIABETES EYE EXAM

## 2013-10-25 ENCOUNTER — Ambulatory Visit: Payer: BC Managed Care – PPO | Admitting: Family Medicine

## 2013-10-29 ENCOUNTER — Encounter: Payer: Self-pay | Admitting: Family Medicine

## 2013-10-31 ENCOUNTER — Encounter: Payer: Self-pay | Admitting: Family Medicine

## 2013-10-31 ENCOUNTER — Ambulatory Visit (INDEPENDENT_AMBULATORY_CARE_PROVIDER_SITE_OTHER): Payer: BC Managed Care – PPO | Admitting: Family Medicine

## 2013-10-31 VITALS — BP 120/74 | HR 125 | Wt 219.0 lb

## 2013-10-31 DIAGNOSIS — E785 Hyperlipidemia, unspecified: Secondary | ICD-10-CM

## 2013-10-31 DIAGNOSIS — Z23 Encounter for immunization: Secondary | ICD-10-CM

## 2013-10-31 DIAGNOSIS — I152 Hypertension secondary to endocrine disorders: Secondary | ICD-10-CM

## 2013-10-31 DIAGNOSIS — I10 Essential (primary) hypertension: Secondary | ICD-10-CM

## 2013-10-31 DIAGNOSIS — E119 Type 2 diabetes mellitus without complications: Secondary | ICD-10-CM

## 2013-10-31 DIAGNOSIS — E1169 Type 2 diabetes mellitus with other specified complication: Secondary | ICD-10-CM

## 2013-10-31 DIAGNOSIS — E669 Obesity, unspecified: Secondary | ICD-10-CM

## 2013-10-31 DIAGNOSIS — E1159 Type 2 diabetes mellitus with other circulatory complications: Secondary | ICD-10-CM

## 2013-10-31 LAB — CBC WITH DIFFERENTIAL/PLATELET
BASOS ABS: 0.1 10*3/uL (ref 0.0–0.1)
BASOS PCT: 1 % (ref 0–1)
EOS ABS: 0.2 10*3/uL (ref 0.0–0.7)
EOS PCT: 2 % (ref 0–5)
HCT: 43.1 % (ref 39.0–52.0)
Hemoglobin: 15 g/dL (ref 13.0–17.0)
Lymphocytes Relative: 32 % (ref 12–46)
Lymphs Abs: 2.5 10*3/uL (ref 0.7–4.0)
MCH: 31.8 pg (ref 26.0–34.0)
MCHC: 34.8 g/dL (ref 30.0–36.0)
MCV: 91.3 fL (ref 78.0–100.0)
Monocytes Absolute: 0.5 10*3/uL (ref 0.1–1.0)
Monocytes Relative: 6 % (ref 3–12)
Neutro Abs: 4.6 10*3/uL (ref 1.7–7.7)
Neutrophils Relative %: 59 % (ref 43–77)
PLATELETS: 321 10*3/uL (ref 150–400)
RBC: 4.72 MIL/uL (ref 4.22–5.81)
RDW: 13.7 % (ref 11.5–15.5)
WBC: 7.8 10*3/uL (ref 4.0–10.5)

## 2013-10-31 LAB — POCT UA - MICROALBUMIN
Albumin/Creatinine Ratio, Urine, POC: 6.5
CREATININE, POC: 181.3 mg/dL
MICROALBUMIN (UR) POC: 11.8 mg/L

## 2013-10-31 LAB — POCT GLYCOSYLATED HEMOGLOBIN (HGB A1C): Hemoglobin A1C: 6.9

## 2013-10-31 NOTE — Progress Notes (Signed)
  Subjective:    Paul Long is a 58 y.o. male who presents for follow-up of Type 2 diabetes mellitus.    Home blood sugar records: PT TEST BID 130 TO 140 AM 130 TO 160 PM  Current symptoms/problems  NONE Daily foot checks:  Any foot concerns: /NONE Last eye exam:  10/15/13 [place] [facility].   Medication compliance:good Current diet: LOW CARB Current exercise: WALKING 30 MIN 7 DAYS A WEEK Known diabetic complications: none Cardiovascular risk factors: advanced age (older than 41 for men, 41 for women), diabetes mellitus, dyslipidemia, hypertension, male gender and obesity (BMI >= 30 kg/m2)   The following portions of the patient's history were reviewed and updated as appropriate: allergies, current medications, past medical history, past social history, past surgical history and problem list.  ROS as in subjective above    Objective:  General appearence: alert, no distress, WD/WN  Lab Review Lab Results  Component Value Date   HGBA1C 7.2 06/21/2013   Lab Results  Component Value Date   CHOL 118 02/07/2012   HDL 44 02/07/2012   LDLCALC 47 02/07/2012   TRIG 134 02/07/2012   CHOLHDL 2.7 02/07/2012   No results found for this basenameConcepcion Elk     Chemistry      Component Value Date/Time   NA 139 02/07/2012 0855   K 4.3 02/07/2012 0855   CL 103 02/07/2012 0855   CO2 26 02/07/2012 0855   BUN 13 02/07/2012 0855   CREATININE 0.85 02/07/2012 0855      Component Value Date/Time   CALCIUM 10.0 02/07/2012 0855   ALKPHOS 73 02/07/2012 0855   AST 27 02/07/2012 0855   ALT 36 02/07/2012 0855   BILITOT 1.0 02/07/2012 0855        Chemistry      Component Value Date/Time   NA 139 02/07/2012 0855   K 4.3 02/07/2012 0855   CL 103 02/07/2012 0855   CO2 26 02/07/2012 0855   BUN 13 02/07/2012 0855   CREATININE 0.85 02/07/2012 0855      Component Value Date/Time   CALCIUM 10.0 02/07/2012 0855   ALKPHOS 73 02/07/2012 0855   AST 27 02/07/2012 0855   ALT 36  02/07/2012 0855   BILITOT 1.0 02/07/2012 0855      Hemoglobin A1c 6.9  Assessment:  Type 2 diabetes mellitus without complication - Plan: POCT glycosylated hemoglobin (Hb A1C), POCT UA - Microalbumin, CBC with Differential, Comprehensive metabolic panel, Lipid panel  Obesity (BMI 30-39.9)  Hypertension associated with diabetes - Plan: CBC with Differential, Comprehensive metabolic panel  Hyperlipidemia LDL goal <70 - Plan: Lipid panel  Need for prophylactic vaccination against Streptococcus pneumoniae (pneumococcus) - Plan: Pneumococcal conjugate vaccine 13-valent  Need for prophylactic vaccination and inoculation against influenza - Plan: Flu Vaccine QUAD 36+ mos IM        Plan:    1.  Rx changes: none 2.  Education: Reviewed 'ABCs' of diabetes management (respective goals in parentheses):  A1C (<7), blood pressure (<130/80), and cholesterol (LDL <100). 3.  Compliance at present is estimated to be good. Efforts to improve compliance (if necessary) will be directed at Nothing. 4. Follow up: 4 months

## 2013-11-01 ENCOUNTER — Encounter: Payer: Self-pay | Admitting: Internal Medicine

## 2013-11-01 LAB — COMPREHENSIVE METABOLIC PANEL
ALBUMIN: 4.7 g/dL (ref 3.5–5.2)
ALK PHOS: 81 U/L (ref 39–117)
ALT: 32 U/L (ref 0–53)
AST: 25 U/L (ref 0–37)
BILIRUBIN TOTAL: 1.1 mg/dL (ref 0.2–1.2)
BUN: 12 mg/dL (ref 6–23)
CO2: 26 mEq/L (ref 19–32)
Calcium: 9.9 mg/dL (ref 8.4–10.5)
Chloride: 102 mEq/L (ref 96–112)
Creat: 0.72 mg/dL (ref 0.50–1.35)
GLUCOSE: 137 mg/dL — AB (ref 70–99)
POTASSIUM: 4.6 meq/L (ref 3.5–5.3)
SODIUM: 138 meq/L (ref 135–145)
TOTAL PROTEIN: 7.1 g/dL (ref 6.0–8.3)

## 2013-11-01 LAB — LIPID PANEL
Cholesterol: 108 mg/dL (ref 0–200)
HDL: 44 mg/dL (ref >39)
LDL CALC: 43 mg/dL (ref 0–99)
TRIGLYCERIDES: 107 mg/dL (ref <150)
Total CHOL/HDL Ratio: 2.5 Ratio
VLDL: 21 mg/dL (ref 0–40)

## 2013-12-03 ENCOUNTER — Other Ambulatory Visit: Payer: Self-pay | Admitting: Family Medicine

## 2013-12-04 ENCOUNTER — Other Ambulatory Visit: Payer: Self-pay | Admitting: Family Medicine

## 2014-02-01 ENCOUNTER — Other Ambulatory Visit: Payer: Self-pay | Admitting: Family Medicine

## 2014-02-04 ENCOUNTER — Other Ambulatory Visit: Payer: Self-pay | Admitting: Family Medicine

## 2014-03-05 ENCOUNTER — Encounter: Payer: Self-pay | Admitting: Family Medicine

## 2014-03-05 ENCOUNTER — Ambulatory Visit (INDEPENDENT_AMBULATORY_CARE_PROVIDER_SITE_OTHER): Payer: BLUE CROSS/BLUE SHIELD | Admitting: Family Medicine

## 2014-03-05 VITALS — BP 116/78 | HR 86 | Wt 222.0 lb

## 2014-03-05 DIAGNOSIS — E669 Obesity, unspecified: Secondary | ICD-10-CM

## 2014-03-05 DIAGNOSIS — E119 Type 2 diabetes mellitus without complications: Secondary | ICD-10-CM

## 2014-03-05 DIAGNOSIS — I152 Hypertension secondary to endocrine disorders: Secondary | ICD-10-CM

## 2014-03-05 DIAGNOSIS — E1159 Type 2 diabetes mellitus with other circulatory complications: Secondary | ICD-10-CM

## 2014-03-05 DIAGNOSIS — E785 Hyperlipidemia, unspecified: Secondary | ICD-10-CM

## 2014-03-05 DIAGNOSIS — M1A9XX0 Chronic gout, unspecified, without tophus (tophi): Secondary | ICD-10-CM

## 2014-03-05 DIAGNOSIS — I10 Essential (primary) hypertension: Secondary | ICD-10-CM

## 2014-03-05 DIAGNOSIS — E1169 Type 2 diabetes mellitus with other specified complication: Secondary | ICD-10-CM

## 2014-03-05 LAB — HEMOGLOBIN A1C
HEMOGLOBIN A1C: 9.2 % — AB (ref <5.7)
Mean Plasma Glucose: 217 mg/dL — ABNORMAL HIGH (ref <117)

## 2014-03-05 NOTE — Progress Notes (Signed)
Subjective:    Paul Long is a 59 y.o. male who presents for follow-up of Type 2 diabetes mellitus.  He also has a history of gout and remains on allopurinol with no symptoms.  Home blood sugar records: Patient test BID  Current symptoms/problems NONE Daily foot checks:   Any foot concerns: NONE  Last eye exam:  10/15/13   Medication compliance: Excellent Current diet: low fat Current exercise: walking 30 ,min daily Known diabetic complications: none Cardiovascular risk factors: advanced age (older than 53 for men, 8 for women), diabetes mellitus, dyslipidemia, hypertension, male gender and obesity (BMI >= 30 kg/m2)   The following portions of the patient's history were reviewed and updated as appropriate: allergies, current medications, past medical history, past social history and problem list.  ROS as in subjective above    Objective:    There were no vitals taken for this visit.  There were no vitals filed for this visit.  General appearence: alert, no distress, WD/WN Neck: supple, no lymphadenopathy, no thyromegaly, no masses Heart: RRR, normal S1, S2, no murmurs Lungs: CTA bilaterally, no wheezes, rhonchi, or rales Abdomen: +bs, soft, non tender, non distended, no masses, no hepatomegaly, no splenomegaly Pulses: 2+ symmetric, upper and lower extremities, normal cap refill Ext: no edema Foot exam:  Neuro: foot monofilament exam normal   Lab Review Lab Results  Component Value Date   HGBA1C 6.9 10/31/2013   Lab Results  Component Value Date   CHOL 108 10/31/2013   HDL 44 10/31/2013   LDLCALC 43 10/31/2013   TRIG 107 10/31/2013   CHOLHDL 2.5 10/31/2013   No results found for: Concepcion Elk   Chemistry      Component Value Date/Time   NA 138 10/31/2013 1015   K 4.6 10/31/2013 1015   CL 102 10/31/2013 1015   CO2 26 10/31/2013 1015   BUN 12 10/31/2013 1015   CREATININE 0.72 10/31/2013 1015      Component Value Date/Time   CALCIUM 9.9  10/31/2013 1015   ALKPHOS 81 10/31/2013 1015   AST 25 10/31/2013 1015   ALT 32 10/31/2013 1015   BILITOT 1.1 10/31/2013 1015        Chemistry      Component Value Date/Time   NA 138 10/31/2013 1015   K 4.6 10/31/2013 1015   CL 102 10/31/2013 1015   CO2 26 10/31/2013 1015   BUN 12 10/31/2013 1015   CREATININE 0.72 10/31/2013 1015      Component Value Date/Time   CALCIUM 9.9 10/31/2013 1015   ALKPHOS 81 10/31/2013 1015   AST 25 10/31/2013 1015   ALT 32 10/31/2013 1015   BILITOT 1.1 10/31/2013 1015     Hemoglobin A1c 9.0.    Assessment:   Encounter Diagnoses  Name Primary?  . Type 2 diabetes mellitus without complication Yes  . Hypertension associated with diabetes   . Hyperlipidemia associated with type 2 diabetes mellitus   . Obesity (BMI 30-39.9)          Plan:    1.  Rx changes: none 2.  Education: Reviewed 'ABCs' of diabetes management (respective goals in parentheses):  A1C (<7), blood pressure (<130/80), and cholesterol (LDL <100). 3.  Compliance at present is estimated to be excellent. Efforts to improve compliance (if necessary) will be directed at Nothing. 4. Follow up: 4 months   There is a question as to the accuracy of the office A1c and I will therefore order a blood study. His numbers and  his account speak against the accuracy.

## 2014-04-09 ENCOUNTER — Ambulatory Visit (INDEPENDENT_AMBULATORY_CARE_PROVIDER_SITE_OTHER): Payer: BLUE CROSS/BLUE SHIELD | Admitting: Family Medicine

## 2014-04-09 ENCOUNTER — Encounter: Payer: Self-pay | Admitting: Family Medicine

## 2014-04-09 VITALS — BP 120/80 | HR 76 | Wt 221.0 lb

## 2014-04-09 DIAGNOSIS — E119 Type 2 diabetes mellitus without complications: Secondary | ICD-10-CM

## 2014-04-09 MED ORDER — CANAGLIFLOZIN 100 MG PO TABS: 100.0000 mg | ORAL_TABLET | Freq: Every day | ORAL | Status: DC

## 2014-04-09 NOTE — Progress Notes (Signed)
   Subjective:    Patient ID: Paul Long, male    DOB: [birth year], 59 y.o.   MRN: [id number]  HPI he is here for recheck. Since his last visit he did get a new glucometer and it does indeed show readings in the 200 range. His eating habits and activity habits have not changed.  Review of Systems     Objective:   Physical Exam Alert and in no distress otherwise not examined Hemoglobin A1c 8.4      Assessment & Plan:  Type 2 diabetes mellitus without complication - Plan: canagliflozin (INVOKANA) 100 MG TABS tablet  I think it is time to add a new medication to his regimen. Discussed possible side effects. A discount card was given. He will call if he has any difficulties otherwise I will see him in roughly 3 months

## 2014-04-19 ENCOUNTER — Telehealth: Payer: Self-pay | Admitting: Family Medicine

## 2014-04-24 NOTE — Telephone Encounter (Signed)
P[employer] approved til 02/14/38, pt informed, faxed pharmacy  

## 2014-05-01 ENCOUNTER — Other Ambulatory Visit: Payer: Self-pay | Admitting: Family Medicine

## 2014-05-02 ENCOUNTER — Other Ambulatory Visit: Payer: Self-pay | Admitting: Family Medicine

## 2014-05-30 ENCOUNTER — Other Ambulatory Visit: Payer: Self-pay | Admitting: Family Medicine

## 2014-07-29 ENCOUNTER — Ambulatory Visit: Payer: BLUE CROSS/BLUE SHIELD | Admitting: Family Medicine

## 2014-08-20 ENCOUNTER — Ambulatory Visit: Payer: BLUE CROSS/BLUE SHIELD | Admitting: Family Medicine

## 2014-08-27 ENCOUNTER — Ambulatory Visit (INDEPENDENT_AMBULATORY_CARE_PROVIDER_SITE_OTHER): Payer: BLUE CROSS/BLUE SHIELD | Admitting: Family Medicine

## 2014-08-27 ENCOUNTER — Encounter: Payer: Self-pay | Admitting: Family Medicine

## 2014-08-27 VITALS — BP 114/70 | HR 95 | Wt 220.0 lb

## 2014-08-27 DIAGNOSIS — M1000 Idiopathic gout, unspecified site: Secondary | ICD-10-CM | POA: Diagnosis not present

## 2014-08-27 DIAGNOSIS — E669 Obesity, unspecified: Secondary | ICD-10-CM

## 2014-08-27 DIAGNOSIS — E119 Type 2 diabetes mellitus without complications: Secondary | ICD-10-CM

## 2014-08-27 DIAGNOSIS — I152 Hypertension secondary to endocrine disorders: Secondary | ICD-10-CM

## 2014-08-27 DIAGNOSIS — E1169 Type 2 diabetes mellitus with other specified complication: Secondary | ICD-10-CM | POA: Diagnosis not present

## 2014-08-27 DIAGNOSIS — E785 Hyperlipidemia, unspecified: Secondary | ICD-10-CM | POA: Diagnosis not present

## 2014-08-27 DIAGNOSIS — I10 Essential (primary) hypertension: Secondary | ICD-10-CM | POA: Diagnosis not present

## 2014-08-27 DIAGNOSIS — E1159 Type 2 diabetes mellitus with other circulatory complications: Secondary | ICD-10-CM

## 2014-08-27 NOTE — Progress Notes (Signed)
  Subjective:    Patient ID: Paul Long Recore, male    DOB: [birth year], 59 y.o.   MRN: [id number]  Paul Long Dottavio is a 59 y.o. male who presents for follow-up of Type 2 diabetes mellitus.  Home blood sugar records: patient test one time a day Current symptoms/problems just A1C Daily foot checks:yes   Any foot concerns: none Exercise: walking 30 min day Eye:10/15/13 He does have a history of gout and is on allopurinol. He has had no outbreaks. The following portions of the patient's history were reviewed and updated as appropriate: allergies, current medications, past medical history, past social history and problem list.  ROS as in subjective above.     Objective:    Physical Exam Alert and in no distress otherwise not examined.  Blood pressure 114/70, pulse 95, weight 220 lb (99.791 kg), SpO2 97 %.  Lab Review Diabetic Labs Latest Ref Rng 03/05/2014 10/31/2013 06/21/2013 02/20/2013 09/25/2012  HbA1c <5.7 % 9.2(H) 6.9 7.2 6.8 6.6  Chol 0 - 200 mg/dL - 409108 - - -  HDL >81>39 mg/dL - 44 - - -  Calc LDL 0 - 99 mg/dL - 43 - - -  Triglycerides <150 mg/dL - 191107 - - -  Creatinine 0.50 - 1.35 mg/dL - 4.780.72 - - -   BP/Weight 08/27/2014 04/09/2014 03/05/2014 10/31/2013 06/21/2013  Systolic BP 114 120 116 120 110  Diastolic BP 70 80 78 74 72  Wt. (Lbs) 220 221 222 219 224  BMI 31.57 31.71 31.85 31.42 32.14   Foot/eye exam completion dates Latest Ref Rng 10/15/2013 09/25/2012  Eye Exam No Retinopathy No Retinopathy no diabetic retinopathy detected  Foot Form Completion - - -    Gery PrayBarry  reports that he has never smoked. He has never used smokeless tobacco. He reports that he does not drink alcohol or use illicit drugs.     Assessment & Plan:    Type 2 diabetes mellitus without complication  Hypertension associated with diabetes  Hyperlipidemia associated with type 2 diabetes mellitus  Obesity (BMI 30-39.9)  Idiopathic gout, unspecified chronicity, unspecified site   Rx changes:  none  Education: Reviewed 'ABCs' of diabetes management (respective goals in parentheses):  A1C (<7), blood pressure (<130/80), and cholesterol (LDL <100).  Compliance at present is estimated to be good. Efforts to improve compliance (if necessary) will be directed at increased exercise.  Follow up: 4 months

## 2014-08-28 ENCOUNTER — Other Ambulatory Visit: Payer: Self-pay

## 2014-08-28 ENCOUNTER — Other Ambulatory Visit: Payer: Self-pay | Admitting: Family Medicine

## 2014-08-28 LAB — HEMOGLOBIN A1C
HEMOGLOBIN A1C: 7.6 % — AB (ref <5.7)
Mean Plasma Glucose: 171 mg/dL — ABNORMAL HIGH (ref <117)

## 2014-08-28 MED ORDER — PIOGLITAZONE HCL-METFORMIN HCL 15-850 MG PO TABS: 1.0000 | ORAL_TABLET | Freq: Two times a day (BID) | ORAL | Status: DC

## 2014-10-04 ENCOUNTER — Ambulatory Visit (INDEPENDENT_AMBULATORY_CARE_PROVIDER_SITE_OTHER): Payer: BLUE CROSS/BLUE SHIELD | Admitting: Family Medicine

## 2014-10-04 ENCOUNTER — Encounter: Payer: Self-pay | Admitting: Family Medicine

## 2014-10-04 VITALS — BP 116/80 | HR 70 | Wt 217.0 lb

## 2014-10-04 DIAGNOSIS — J069 Acute upper respiratory infection, unspecified: Secondary | ICD-10-CM | POA: Diagnosis not present

## 2014-10-04 DIAGNOSIS — S0512XA Contusion of eyeball and orbital tissues, left eye, initial encounter: Secondary | ICD-10-CM | POA: Diagnosis not present

## 2014-10-04 NOTE — Progress Notes (Signed)
   Subjective:    Patient ID: Paul Long, male    DOB: [birth year], 59 y.o.   MRN: [id number]  HPI He fell on the 10th injuring the left orbital area. He apparently tripped over a curb. He has done well until 2 days ago when he noted increased difficulty with headache but it is been across the forehead. He is also had some nasal congestion, rhinorrheaas well as some slight bloody discharge mainly from the left   Review of Systems     Objective:   Physical Exam Alert and in no distress. Swelling and erythema is noted in the upper and lower orbital area. EOMI. Conjunctiva and anterior chamber normal. TMs clear. Throat is clear. Neck supple without adenopathy.       Assessment & Plan:  Contusion, orbital tissues, left, initial encounter  Acute URI Recommend conservative care for the contusion. I do not think that he needs further intervention with this. Recommend URI care with Advil Cold and Sinus or no: Sinus as well as Afrin nasal spray mainly at night.

## 2014-10-04 NOTE — Patient Instructions (Signed)
Can use Advil Cold and Sinus or Tylenol Cold and sinus and Flonase. Use Afrin only at night and also take Advil for the pain

## 2014-10-30 ENCOUNTER — Other Ambulatory Visit: Payer: Self-pay | Admitting: Family Medicine

## 2014-11-19 ENCOUNTER — Other Ambulatory Visit (INDEPENDENT_AMBULATORY_CARE_PROVIDER_SITE_OTHER): Payer: BLUE CROSS/BLUE SHIELD

## 2014-11-19 DIAGNOSIS — Z23 Encounter for immunization: Secondary | ICD-10-CM

## 2014-12-02 LAB — HM DIABETES EYE EXAM

## 2014-12-03 ENCOUNTER — Encounter: Payer: Self-pay | Admitting: Family Medicine

## 2014-12-29 ENCOUNTER — Other Ambulatory Visit: Payer: Self-pay | Admitting: Family Medicine

## 2015-01-13 ENCOUNTER — Encounter: Payer: Self-pay | Admitting: Family Medicine

## 2015-01-13 ENCOUNTER — Ambulatory Visit (INDEPENDENT_AMBULATORY_CARE_PROVIDER_SITE_OTHER): Payer: BLUE CROSS/BLUE SHIELD | Admitting: Family Medicine

## 2015-01-13 VITALS — BP 118/70 | HR 91 | Ht 70.0 in | Wt 221.0 lb

## 2015-01-13 DIAGNOSIS — I10 Essential (primary) hypertension: Secondary | ICD-10-CM

## 2015-01-13 DIAGNOSIS — Z1159 Encounter for screening for other viral diseases: Secondary | ICD-10-CM

## 2015-01-13 DIAGNOSIS — Z79899 Other long term (current) drug therapy: Secondary | ICD-10-CM | POA: Diagnosis not present

## 2015-01-13 DIAGNOSIS — E669 Obesity, unspecified: Secondary | ICD-10-CM

## 2015-01-13 DIAGNOSIS — E118 Type 2 diabetes mellitus with unspecified complications: Secondary | ICD-10-CM | POA: Diagnosis not present

## 2015-01-13 DIAGNOSIS — E785 Hyperlipidemia, unspecified: Secondary | ICD-10-CM

## 2015-01-13 DIAGNOSIS — E1169 Type 2 diabetes mellitus with other specified complication: Secondary | ICD-10-CM

## 2015-01-13 DIAGNOSIS — E1159 Type 2 diabetes mellitus with other circulatory complications: Secondary | ICD-10-CM | POA: Diagnosis not present

## 2015-01-13 DIAGNOSIS — I152 Hypertension secondary to endocrine disorders: Secondary | ICD-10-CM

## 2015-01-13 LAB — POCT UA - MICROALBUMIN
Albumin/Creatinine Ratio, Urine, POC: 5.4
Creatinine, POC: 110.2 mg/dL
MICROALBUMIN (UR) POC: 5.9 mg/L

## 2015-01-13 LAB — LIPID PANEL
CHOL/HDL RATIO: 3.1 ratio (ref <=5.0)
Cholesterol: 113 mg/dL — ABNORMAL LOW (ref 125–200)
HDL: 37 mg/dL — ABNORMAL LOW (ref >=40)
LDL CALC: 47 mg/dL (ref <130)
Triglycerides: 147 mg/dL (ref <150)
VLDL: 29 mg/dL (ref <30)

## 2015-01-13 LAB — CBC WITH DIFFERENTIAL/PLATELET
BASOS PCT: 1 % (ref 0–1)
Basophils Absolute: 0.1 10*3/uL (ref 0.0–0.1)
EOS ABS: 0.2 10*3/uL (ref 0.0–0.7)
EOS PCT: 3 % (ref 0–5)
HCT: 45 % (ref 39.0–52.0)
Hemoglobin: 15.2 g/dL (ref 13.0–17.0)
LYMPHS ABS: 2.7 10*3/uL (ref 0.7–4.0)
Lymphocytes Relative: 33 % (ref 12–46)
MCH: 31.9 pg (ref 26.0–34.0)
MCHC: 33.8 g/dL (ref 30.0–36.0)
MCV: 94.3 fL (ref 78.0–100.0)
MPV: 9.1 fL (ref 8.6–12.4)
Monocytes Absolute: 0.6 10*3/uL (ref 0.1–1.0)
Monocytes Relative: 7 % (ref 3–12)
Neutro Abs: 4.5 10*3/uL (ref 1.7–7.7)
Neutrophils Relative %: 56 % (ref 43–77)
PLATELETS: 288 10*3/uL (ref 150–400)
RBC: 4.77 MIL/uL (ref 4.22–5.81)
RDW: 14.2 % (ref 11.5–15.5)
WBC: 8.1 10*3/uL (ref 4.0–10.5)

## 2015-01-13 LAB — POCT GLYCOSYLATED HEMOGLOBIN (HGB A1C): Hemoglobin A1C: 7.8

## 2015-01-13 LAB — COMPREHENSIVE METABOLIC PANEL
ALBUMIN: 4.6 g/dL (ref 3.6–5.1)
ALK PHOS: 99 U/L (ref 40–115)
ALT: 39 U/L (ref 9–46)
AST: 27 U/L (ref 10–35)
BILIRUBIN TOTAL: 0.5 mg/dL (ref 0.2–1.2)
BUN: 14 mg/dL (ref 7–25)
CO2: 28 mmol/L (ref 20–31)
CREATININE: 0.84 mg/dL (ref 0.70–1.33)
Calcium: 9.5 mg/dL (ref 8.6–10.3)
Chloride: 101 mmol/L (ref 98–110)
Glucose, Bld: 157 mg/dL — ABNORMAL HIGH (ref 65–99)
Potassium: 4.4 mmol/L (ref 3.5–5.3)
Sodium: 136 mmol/L (ref 135–146)
TOTAL PROTEIN: 7 g/dL (ref 6.1–8.1)

## 2015-01-13 MED ORDER — LISINOPRIL 10 MG PO TABS: 10.0000 mg | ORAL_TABLET | Freq: Every day | ORAL | Status: DC

## 2015-01-13 MED ORDER — ATORVASTATIN CALCIUM 20 MG PO TABS: ORAL_TABLET | ORAL | Status: DC

## 2015-01-13 MED ORDER — CANAGLIFLOZIN 300 MG PO TABS: 300.0000 mg | ORAL_TABLET | Freq: Every day | ORAL | Status: DC

## 2015-01-13 MED ORDER — PIOGLITAZONE HCL-METFORMIN HCL 15-850 MG PO TABS: ORAL_TABLET | ORAL | Status: DC

## 2015-01-13 NOTE — Progress Notes (Signed)
  Subjective:    Patient ID: Paul Long, male    DOB: [birth year], 59 y.o.   MRN: [id number]  Paul Long is a 59 y.o. male who presents for follow-up of Type 2 diabetes mellitus.  Home blood sugar records: Patient  test BID Current symptoms/problems none Daily foot checks:yes  Any foot concerns: none Exercise: walking 30 min 7 days a week Eye: 12/02/14 The following portions of the patient's history were reviewed and updated as appropriate: allergies, current medications, past medical history, past social history and problem list.  ROS as in subjective above.     Objective:    Physical Exam Alert and in no distress otherwise not examined.  Lab Review Diabetic Labs Latest Ref Rng 08/27/2014 03/05/2014 10/31/2013 06/21/2013 02/20/2013  HbA1c <5.7 % 7.6(H) 9.2(H) 6.9 7.2 6.8  Chol 0 - 200 mg/dL - - 108 - -  HDL >39 mg/dL - - 44 - -  Calc LDL 0 - 99 mg/dL - - 43 - -  Triglycerides <150 mg/dL - - 107 - -  Creatinine 0.50 - 1.35 mg/dL - - 0.72 - -   BP/Weight 10/04/2014 08/27/2014 04/09/2014 03/05/2014 2/41/5901  Systolic BP 724 195 424 814 439  Diastolic BP 80 70 80 78 74  Wt. (Lbs) 217 220 221 222 219  BMI 31.14 31.57 31.71 31.85 31.42   Foot/eye exam completion dates Latest Ref Rng 12/02/2014 10/15/2013  Eye Exam No Retinopathy No Retinopathy No Retinopathy  Foot Form Completion - - -    Conlin  reports that he has never smoked. He has never used smokeless tobacco. He reports that he does not drink alcohol or use illicit drugs. A1c 7.8    Assessment & Plan:    Type 2 diabetes mellitus with complication, without long-term current use of insulin (HCC) - Plan: POCT glycosylated hemoglobin (Hb A1C), Comprehensive metabolic panel, CBC with Differential/Platelet, POCT UA - Microalbumin, Lipid panel, canagliflozin (INVOKANA) 300 MG TABS tablet, pioglitazone-metformin (ACTOPLUS MET) 15-850 MG tablet  Hypertension associated with diabetes ([place]) - Plan: Comprehensive metabolic panel,  CBC with Differential/Platelet, lisinopril (PRINIVIL,ZESTRIL) 10 MG tablet  Hyperlipidemia associated with type 2 diabetes mellitus ([place]) - Plan: Lipid panel, atorvastatin (LIPITOR) 20 MG tablet  Obesity (BMI 30-39.9) - Plan: Lipid panel  Need for hepatitis C screening test - Plan: Hepatitis C antibody  Encounter for long-term (current) use of medications - Plan: Comprehensive metabolic panel, CBC with Differential/Platelet, Lipid panel   1. Rx changes: Invokana increased to 300 mg 2. Education: Reviewed 'ABCs' of diabetes management (respective goals in parentheses):  A1C (<7), blood pressure (<130/80), and cholesterol (LDL <100). 3. Compliance at present is estimated to be fair. Efforts to improve compliance (if necessary) will be directed at increased exercise. 4. Follow up: 4 months

## 2015-01-14 LAB — HEPATITIS C ANTIBODY: HCV Ab: NEGATIVE

## 2015-02-27 ENCOUNTER — Other Ambulatory Visit: Payer: Self-pay | Admitting: Family Medicine

## 2015-03-04 ENCOUNTER — Other Ambulatory Visit: Payer: Self-pay | Admitting: Family Medicine

## 2015-05-02 ENCOUNTER — Ambulatory Visit (INDEPENDENT_AMBULATORY_CARE_PROVIDER_SITE_OTHER): Payer: BLUE CROSS/BLUE SHIELD | Admitting: Family Medicine

## 2015-05-02 ENCOUNTER — Other Ambulatory Visit: Payer: Self-pay | Admitting: Family Medicine

## 2015-05-02 ENCOUNTER — Encounter: Payer: Self-pay | Admitting: Family Medicine

## 2015-05-02 VITALS — BP 100/76 | HR 97 | Ht 70.0 in | Wt 220.0 lb

## 2015-05-02 DIAGNOSIS — I10 Essential (primary) hypertension: Secondary | ICD-10-CM

## 2015-05-02 DIAGNOSIS — E1159 Type 2 diabetes mellitus with other circulatory complications: Secondary | ICD-10-CM

## 2015-05-02 DIAGNOSIS — Z23 Encounter for immunization: Secondary | ICD-10-CM

## 2015-05-02 DIAGNOSIS — E1169 Type 2 diabetes mellitus with other specified complication: Secondary | ICD-10-CM | POA: Diagnosis not present

## 2015-05-02 DIAGNOSIS — E669 Obesity, unspecified: Secondary | ICD-10-CM | POA: Diagnosis not present

## 2015-05-02 DIAGNOSIS — E118 Type 2 diabetes mellitus with unspecified complications: Secondary | ICD-10-CM

## 2015-05-02 DIAGNOSIS — E785 Hyperlipidemia, unspecified: Secondary | ICD-10-CM

## 2015-05-02 DIAGNOSIS — I152 Hypertension secondary to endocrine disorders: Secondary | ICD-10-CM

## 2015-05-02 LAB — POCT GLYCOSYLATED HEMOGLOBIN (HGB A1C): Hemoglobin A1C: 8

## 2015-05-02 NOTE — Patient Instructions (Signed)
Go to the American diabetes Association website and look at their dietary recommendations Cutback on "white food" Look over the information you have at home and apply it

## 2015-05-02 NOTE — Progress Notes (Signed)
  Subjective:    Patient ID: Paul BlanksBarry W Burling, male    DOB: 12-17-1955, 60 y.o.   MRN: [id number]  Paul Long is a 60 y.o. male who presents for follow-up of Type 2 diabetes mellitus.slope note is the fact that he now has a new job. He was on his previous job for 35 years however the left angle apparently because of economic reasons.  Patient is checking home blood sugars.   Home blood sugar records: between 150 and 180  How often is blood sugars being checked: Once a day Current symptoms/problems include none and have been unchanged. Daily foot checks: Yes   Any foot concerns NO Last eye exam:Oct. 2016 Exercise: Home exercise routine includes walking 1/2 hrs per day. Not really eating a diabetic diet. The following portions of the patient's history were reviewed and updated as appropriate: allergies, current medications, past medical history, past social history and problem list.  ROS as in subjective above.     Objective:    Physical Exam Alert and in no distress otherwise not examined.  Height 5\' 10"  (1.778 m), weight 220 lb (99.791 kg).  Lab Review Diabetic Labs Latest Ref Rng 01/13/2015 08/27/2014 03/05/2014 10/31/2013 06/21/2013  HbA1c - 7.8 7.6(H) 9.2(H) 6.9 7.2  Chol 125 - 200 mg/dL 409(W113(L) - - 119108 -  HDL >=14>=40 mg/dL 78(G37(L) - - 44 -  Calc LDL <130 mg/dL 47 - - 43 -  Triglycerides <150 mg/dL 956147 - - 213107 -  Creatinine 0.70 - 1.33 mg/dL 0.860.84 - - 5.780.72 -   BP/Weight 05/02/2015 01/13/2015 10/04/2014 08/27/2014 04/09/2014  Systolic BP - 118 116 114 120  Diastolic BP - 70 80 70 80  Wt. (Lbs) 220 221 217 220 221  BMI 31.57 31.71 31.14 31.57 31.71   Foot/eye exam completion dates Latest Ref Rng 01/13/2015 12/02/2014  Eye Exam No Retinopathy - No Retinopathy  Foot Form Completion - Done -  A1c is 8.2  Gery PrayBarry  reports that he has never smoked. He has never used smokeless tobacco. He reports that he does not drink alcohol or use illicit drugs.     Assessment & Plan:    Type 2  diabetes mellitus with complication, without long-term current use of insulin (HCC) - Plan: HgB A1c  Need for prophylactic vaccination against Streptococcus pneumoniae (pneumococcus) - Plan: Pneumococcal polysaccharide vaccine 23-valent greater than or equal to 2yo subcutaneous/IM  Need for shingles vaccine - Plan: Varicella-zoster vaccine subcutaneous  Obesity (BMI 30-39.9)  Hypertension associated with diabetes (HCC)  Hyperlipidemia associated with type 2 diabetes mellitus (HCC)   1. Rx changes: none 2. Education: Reviewed 'ABCs' of diabetes management (respective goals in parentheses):  A1C (<7), blood pressure (<130/80), and cholesterol (LDL <100). 3. Compliance at present is estimated to be fair. Efforts to improve compliance (if necessary) will be directed at increased exercise.and dietary modifications. He will go back and review the diabetes and nutrition information given to him and his wife when she went to her diabetes program. 4. Follow up: 4 months  5. I discussed the fact that it is time to either add another medication and I discussed insulin versus GLP-1. He would like to see if he can make further dietary and exercise adjustments. Otherwise I will probably start him on a GLP-1 his next visit. His immunizations were updated.

## 2015-05-05 NOTE — Telephone Encounter (Signed)
Is this ok to refill?  

## 2015-06-04 ENCOUNTER — Other Ambulatory Visit: Payer: Self-pay | Admitting: Family Medicine

## 2015-08-06 ENCOUNTER — Other Ambulatory Visit: Payer: Self-pay | Admitting: Family Medicine

## 2015-08-20 ENCOUNTER — Telehealth: Payer: Self-pay

## 2015-08-20 ENCOUNTER — Other Ambulatory Visit: Payer: Self-pay

## 2015-08-20 DIAGNOSIS — E118 Type 2 diabetes mellitus with unspecified complications: Secondary | ICD-10-CM

## 2015-08-20 MED ORDER — CANAGLIFLOZIN 300 MG PO TABS: 300.0000 mg | ORAL_TABLET | Freq: Every day | ORAL | Status: DC

## 2015-08-20 NOTE — Telephone Encounter (Signed)
Sent med in 

## 2015-08-20 NOTE — Telephone Encounter (Signed)
Fax request for refill of Invokana 300 mg to [facility] on [address]

## 2015-09-09 ENCOUNTER — Ambulatory Visit (INDEPENDENT_AMBULATORY_CARE_PROVIDER_SITE_OTHER): Payer: BLUE CROSS/BLUE SHIELD | Admitting: Family Medicine

## 2015-09-09 ENCOUNTER — Telehealth: Payer: Self-pay | Admitting: Family Medicine

## 2015-09-09 ENCOUNTER — Encounter: Payer: Self-pay | Admitting: Family Medicine

## 2015-09-09 VITALS — BP 114/70 | HR 85 | Ht 70.0 in | Wt 217.0 lb

## 2015-09-09 DIAGNOSIS — Z1211 Encounter for screening for malignant neoplasm of colon: Secondary | ICD-10-CM | POA: Diagnosis not present

## 2015-09-09 DIAGNOSIS — E669 Obesity, unspecified: Secondary | ICD-10-CM | POA: Diagnosis not present

## 2015-09-09 DIAGNOSIS — I152 Hypertension secondary to endocrine disorders: Secondary | ICD-10-CM

## 2015-09-09 DIAGNOSIS — I10 Essential (primary) hypertension: Secondary | ICD-10-CM

## 2015-09-09 DIAGNOSIS — E1169 Type 2 diabetes mellitus with other specified complication: Secondary | ICD-10-CM | POA: Diagnosis not present

## 2015-09-09 DIAGNOSIS — E785 Hyperlipidemia, unspecified: Secondary | ICD-10-CM

## 2015-09-09 DIAGNOSIS — E1159 Type 2 diabetes mellitus with other circulatory complications: Secondary | ICD-10-CM

## 2015-09-09 DIAGNOSIS — E118 Type 2 diabetes mellitus with unspecified complications: Secondary | ICD-10-CM

## 2015-09-09 LAB — POCT GLYCOSYLATED HEMOGLOBIN (HGB A1C): Hemoglobin A1C: 7.2

## 2015-09-09 NOTE — Progress Notes (Signed)
  Subjective:    Patient ID: Paul Long, male    DOB: [birth year], 60 y.o.   MRN: [id number]  Paul Long is a 60 y.o. male who presents for follow-up of Type 2 diabetes mellitus.  Patient is checking home blood sugars.   Home blood sugar records 140 to 180 How often is blood sugars being checked: Once a day Current symptoms/problems none Daily foot checks: yes  Any foot concerns: none Last eye exam: 12/02/14 Exercise:walking  He continues on his diabetes related medications as well as lisinopril, omega-3 and atorvastatin. The following portions of the patient's history were reviewed and updated as appropriate: allergies, current medications, past medical history, past social history and problem list.  ROS as in subjective above.     Objective:    Physical Exam Alert and in no distress otherwise not examined.   Lab Review Diabetic Labs Latest Ref Rng & Units 05/02/2015 01/13/2015 08/27/2014 03/05/2014 10/31/2013  HbA1c - 8 2 7.8 7.6(H) 9.2(H) 6.9  Chol 125 - 200 mg/dL - 865(H) - - 846  HDL >=96 mg/dL - 29(B) - - 44  Calc LDL <130 mg/dL - 47 - - 43  Triglycerides <150 mg/dL - 284 - - 132  Creatinine 0.70 - 1.33 mg/dL - 4.40 - - 1.02   BP/Weight 05/02/2015 01/13/2015 10/04/2014 08/27/2014 04/09/2014  Systolic BP 100 118 116 114 120  Diastolic BP 76 70 80 70 80  Wt. (Lbs) 220 221 217 220 221  BMI 31.57 31.71 31.14 31.57 31.71   Foot/eye exam completion dates Latest Ref Rng & Units 01/13/2015 12/02/2014  Eye Exam No Retinopathy - No Retinopathy  Foot Form Completion - Done -  A1c is 7.2  Paul Long  reports that he has never smoked. He has never used smokeless tobacco. He reports that he does not drink alcohol or use drugs.     Assessment & Plan:    Type 2 diabetes mellitus with complication, without long-term current use of insulin (HCC) - Plan: POCT glycosylated hemoglobin (Hb A1C)  Screening for colon cancer - Plan: Ambulatory referral to Gastroenterology  Obesity (BMI  30-39.9)  Hypertension associated with diabetes (HCC)  Hyperlipidemia associated with type 2 diabetes mellitus (HCC)   1. Rx changes: none 2. Education: Reviewed 'ABCs' of diabetes management (respective goals in parentheses):  A1C (<7), blood pressure (<130/80), and cholesterol (LDL <100). 3. Compliance at present is estimated to be good. Efforts to improve compliance (if necessary) will be directed at increased exercise. 4. Follow up: 4 months  On his last visit I discussed the possibility of adding another medication including insulin. He obviously has made appropriate changes. Encouraged him to continue with this and increase his physical activity.

## 2015-09-09 NOTE — Telephone Encounter (Signed)
Pt wants to make sure that we do set him up for a colonoscopy and call him or have the colonoscopy office call him as he discussed with Dr Susann Givens

## 2015-09-16 ENCOUNTER — Encounter: Payer: Self-pay | Admitting: Gastroenterology

## 2015-11-28 ENCOUNTER — Encounter: Payer: Self-pay | Admitting: Gastroenterology

## 2015-11-28 ENCOUNTER — Ambulatory Visit (AMBULATORY_SURGERY_CENTER): Payer: Self-pay | Admitting: *Deleted

## 2015-11-28 VITALS — Ht 71.0 in | Wt 219.4 lb

## 2015-11-28 DIAGNOSIS — Z1211 Encounter for screening for malignant neoplasm of colon: Secondary | ICD-10-CM

## 2015-11-28 MED ORDER — NA SULFATE-K SULFATE-MG SULF 17.5-3.13-1.6 GM/177ML PO SOLN: 1.0000 | Freq: Once | ORAL | 0 refills | Status: AC

## 2015-11-28 NOTE — Progress Notes (Signed)
No allergies to eggs or soy. No problems with anesthesia.  Pt given Emmi instructions for colonoscopy  No oxygen use  No diet drug use  

## 2015-11-30 ENCOUNTER — Other Ambulatory Visit: Payer: Self-pay | Admitting: Family Medicine

## 2015-12-05 ENCOUNTER — Ambulatory Visit (INDEPENDENT_AMBULATORY_CARE_PROVIDER_SITE_OTHER): Payer: BLUE CROSS/BLUE SHIELD | Admitting: Family Medicine

## 2015-12-05 ENCOUNTER — Encounter: Payer: Self-pay | Admitting: Family Medicine

## 2015-12-05 VITALS — BP 100/70 | HR 88 | Ht 73.0 in | Wt 220.0 lb

## 2015-12-05 DIAGNOSIS — Z125 Encounter for screening for malignant neoplasm of prostate: Secondary | ICD-10-CM | POA: Diagnosis not present

## 2015-12-05 DIAGNOSIS — E669 Obesity, unspecified: Secondary | ICD-10-CM | POA: Diagnosis not present

## 2015-12-05 DIAGNOSIS — E118 Type 2 diabetes mellitus with unspecified complications: Secondary | ICD-10-CM

## 2015-12-05 DIAGNOSIS — Z23 Encounter for immunization: Secondary | ICD-10-CM

## 2015-12-05 DIAGNOSIS — E1169 Type 2 diabetes mellitus with other specified complication: Secondary | ICD-10-CM

## 2015-12-05 DIAGNOSIS — Z0000 Encounter for general adult medical examination without abnormal findings: Secondary | ICD-10-CM

## 2015-12-05 DIAGNOSIS — I152 Hypertension secondary to endocrine disorders: Secondary | ICD-10-CM

## 2015-12-05 DIAGNOSIS — E785 Hyperlipidemia, unspecified: Secondary | ICD-10-CM

## 2015-12-05 DIAGNOSIS — I10 Essential (primary) hypertension: Secondary | ICD-10-CM | POA: Diagnosis not present

## 2015-12-05 DIAGNOSIS — E1159 Type 2 diabetes mellitus with other circulatory complications: Secondary | ICD-10-CM

## 2015-12-05 DIAGNOSIS — M1000 Idiopathic gout, unspecified site: Secondary | ICD-10-CM

## 2015-12-05 LAB — POCT URINALYSIS DIPSTICK
Bilirubin, UA: NEGATIVE
GLUCOSE UA: NEGATIVE
Ketones, UA: NEGATIVE
Leukocytes, UA: NEGATIVE
NITRITE UA: NEGATIVE
PROTEIN UA: NEGATIVE
RBC UA: NEGATIVE
UROBILINOGEN UA: NEGATIVE
pH, UA: 5

## 2015-12-05 LAB — LIPID PANEL
Cholesterol: 125 mg/dL (ref 125–200)
HDL: 44 mg/dL (ref >=40)
LDL CALC: 54 mg/dL (ref <130)
Total CHOL/HDL Ratio: 2.8 Ratio (ref <=5.0)
Triglycerides: 136 mg/dL (ref <150)
VLDL: 27 mg/dL (ref <30)

## 2015-12-05 LAB — COMPREHENSIVE METABOLIC PANEL
ALT: 24 U/L (ref 9–46)
AST: 22 U/L (ref 10–35)
Albumin: 4.7 g/dL (ref 3.6–5.1)
Alkaline Phosphatase: 96 U/L (ref 40–115)
BILIRUBIN TOTAL: 1 mg/dL (ref 0.2–1.2)
BUN: 15 mg/dL (ref 7–25)
CO2: 23 mmol/L (ref 20–31)
CREATININE: 0.95 mg/dL (ref 0.70–1.25)
Calcium: 9.6 mg/dL (ref 8.6–10.3)
Chloride: 100 mmol/L (ref 98–110)
GLUCOSE: 94 mg/dL (ref 65–99)
Potassium: 4.2 mmol/L (ref 3.5–5.3)
SODIUM: 138 mmol/L (ref 135–146)
Total Protein: 7.4 g/dL (ref 6.1–8.1)

## 2015-12-05 LAB — CBC WITH DIFFERENTIAL/PLATELET
BASOS PCT: 0 %
Basophils Absolute: 0 cells/uL (ref 0–200)
EOS PCT: 2 %
Eosinophils Absolute: 204 cells/uL (ref 15–500)
HCT: 44.4 % (ref 38.5–50.0)
Hemoglobin: 15.2 g/dL (ref 13.2–17.1)
LYMPHS ABS: 3366 {cells}/uL (ref 850–3900)
LYMPHS PCT: 33 %
MCH: 32.3 pg (ref 27.0–33.0)
MCHC: 34.2 g/dL (ref 32.0–36.0)
MCV: 94.3 fL (ref 80.0–100.0)
MPV: 9 fL (ref 7.5–12.5)
Monocytes Absolute: 714 cells/uL (ref 200–950)
Monocytes Relative: 7 %
NEUTROS PCT: 58 %
Neutro Abs: 5916 cells/uL (ref 1500–7800)
Platelets: 290 10*3/uL (ref 140–400)
RBC: 4.71 MIL/uL (ref 4.20–5.80)
RDW: 13.6 % (ref 11.0–15.0)
WBC: 10.2 10*3/uL (ref 4.0–10.5)

## 2015-12-05 LAB — PSA: PSA: 0.3 ng/mL (ref <=4.0)

## 2015-12-05 LAB — URIC ACID: URIC ACID, SERUM: 3.9 mg/dL — AB (ref 4.0–8.0)

## 2015-12-05 LAB — POCT GLYCOSYLATED HEMOGLOBIN (HGB A1C): Hemoglobin A1C: 7.5

## 2015-12-05 NOTE — Progress Notes (Signed)
Subjective:    Patient ID: Paul Long, male    DOB: 07/06/1955, 60 y.o.   MRN: [id number]  HPI He is here for complete examination. He does have underlying diabetes and notes blood sugars in the 150 280 range. He states that he has made some diet and exercise changes since his last visit. He does check his feet regularly. Has an eye exam set up for January. He does not smoke. He continues on Actos plus. He also takes lisinopril for his blood pressure and having no trouble with that. He continues on Lipitor and has no muscle aches or pains. He also is taking Invokana for his diabetes. He is scheduled for colonoscopy in the near future. He does have gout and continues on allopurinol. He has not had an attack in quite some time. He has no other concerns or complaints. Work is going well. Home life is stable. Family and social history as well as health maintenance and immunizations were reviewed.   Review of Systems  All other systems reviewed and are negative.      Objective:   Physical Exam BP 100/70   Pulse 88   Ht 6\' 1"  (1.854 m)   Wt 220 lb (99.8 kg)   BMI 29.03 kg/m   General Appearance:    Alert, cooperative, no distress, appears stated age  Head:    Normocephalic, without obvious abnormality, atraumatic  Eyes:    PERRL, conjunctiva/corneas clear, EOM's intact, fundi    benign  Ears:    Normal TM's and external ear canals  Nose:   Nares normal, mucosa normal, no drainage or sinus   tenderness  Throat:   Lips, mucosa, and tongue normal; teeth and gums normal  Neck:   Supple, no lymphadenopathy;  thyroid:  no   enlargement/tenderness/nodules; no carotid   bruit or JVD     Lungs:     Clear to auscultation bilaterally without wheezes, rales or     ronchi; respirations unlabored      Heart:    Regular rate and rhythm, S1 and S2 normal, no murmur, rub   or gallop     Abdomen:     Soft, non-tender, nondistended, normoactive bowel sounds,    no masses, no hepatosplenomegaly    Genitalia:    Normal male external genitalia without lesions.  Testicles without masses.  No inguinal hernias.  Rectal:  Deferred  Extremities:   No clubbing, cyanosis or edema  Pulses:   2+ and symmetric all extremities  Skin:   Skin color, texture, turgor normal, no rashes or lesions  Lymph nodes:   Cervical, supraclavicular, and axillary nodes normal  Neurologic:   CNII-XII intact, normal strength, sensation and gait; reflexes 2+ and symmetric throughout          Psych:   Normal mood, affect, hygiene and grooming.          Assessment & Plan:  Routine general medical examination at a health care facility  Hypertension associated with diabetes (HCC)  Type 2 diabetes mellitus with complication, without long-term current use of insulin (HCC) - Plan: HgB A1c, POCT Urinalysis Dipstick, CBC with Differential/Platelet, Comprehensive metabolic panel, Lipid panel  Hyperlipidemia associated with type 2 diabetes mellitus (HCC)  Obesity (BMI 30-39.9)  Idiopathic gout, unspecified chronicity, unspecified site - Plan: Uric Acid  Need for prophylactic vaccination and inoculation against influenza - Plan: Flu Vaccine QUAD 36+ mos PF IM (Fluarix & Fluzone Quad PF)  Screening for prostate cancer - Plan: PSA  Prostate cancer screening discussed with him. His immunizations were updated. Strongly encouraged him to make further dietary and exercise changes as his diabetes is slowly becoming more more of an issue. He recognizes this.

## 2015-12-12 ENCOUNTER — Encounter: Payer: Self-pay | Admitting: Family Medicine

## 2015-12-12 ENCOUNTER — Ambulatory Visit (AMBULATORY_SURGERY_CENTER): Payer: BLUE CROSS/BLUE SHIELD | Admitting: Gastroenterology

## 2015-12-12 ENCOUNTER — Encounter: Payer: Self-pay | Admitting: Gastroenterology

## 2015-12-12 VITALS — BP 107/69 | HR 69 | Temp 96.4°F | Resp 11 | Ht 71.0 in | Wt 219.0 lb

## 2015-12-12 DIAGNOSIS — Z1211 Encounter for screening for malignant neoplasm of colon: Secondary | ICD-10-CM | POA: Diagnosis present

## 2015-12-12 DIAGNOSIS — Z1212 Encounter for screening for malignant neoplasm of rectum: Secondary | ICD-10-CM | POA: Diagnosis not present

## 2015-12-12 MED ORDER — SODIUM CHLORIDE 0.9 % IV SOLN: 500.0000 mL | INTRAVENOUS | Status: AC

## 2015-12-12 NOTE — Op Note (Signed)
[facility] Patient Name: Paul Long Procedure Date: 12/12/2015 7:43 AM MRN: [id number] Endoscopist: Meryl Dare , MD Age: 60 Referring MD:  Date of Birth: 09-29-1955 Gender: Male Account #: [id number] Procedure:                Colonoscopy Indications:              Screening for colorectal malignant neoplasm Medicines:                Monitored Anesthesia Care Procedure:                Pre-Anesthesia Assessment:                           - Prior to the procedure, a History and Physical                            was performed, and patient medications and                            allergies were reviewed. The patient's tolerance of                            previous anesthesia was also reviewed. The risks                            and benefits of the procedure and the sedation                            options and risks were discussed with the patient.                            All questions were answered, and informed consent                            was obtained. Prior Anticoagulants: The patient has                            taken no previous anticoagulant or antiplatelet                            agents. ASA Grade Assessment: II - A patient with                            mild systemic disease. After reviewing the risks                            and benefits, the patient was deemed in                            satisfactory condition to undergo the procedure.                           After obtaining informed consent, the colonoscope  was passed under direct vision. Throughout the                            procedure, the patient's blood pressure, pulse, and                            oxygen saturations were monitored continuously. The                            Model PCF-H190DL [id number]) scope was introduced                            through the anus and advanced to the the cecum,                            identified by  appendiceal orifice and ileocecal                            valve. The ileocecal valve, appendiceal orifice,                            and rectum were photographed. The quality of the                            bowel preparation was excellent. The colonoscopy                            was performed without difficulty. The patient                            tolerated the procedure well. Scope In: 8:41:25 AM Scope Out: 8:52:26 AM Scope Withdrawal Time: 0 hours 9 minutes 40 seconds  Total Procedure Duration: 0 hours 11 minutes 1 second  Findings:                 The perianal and digital rectal examinations were                            normal.                           The entire examined colon appeared normal on direct                            and retroflexion views. Complications:            No immediate complications. Estimated blood loss:                            None. Estimated Blood Loss:     Estimated blood loss: none. Impression:               - The entire examined colon is normal on direct and                            retroflexion views.                           -  No specimens collected. Recommendation:           - Repeat colonoscopy in 10 years for screening                            purposes.                           - Patient has a contact number available for                            emergencies. The signs and symptoms of potential                            delayed complications were discussed with the                            patient. Return to normal activities tomorrow.                            Written discharge instructions were provided to the                            patient.                           - Resume previous diet.                           - Continue present medications. Meryl DareMalcolm T Breiona Couvillon, MD 12/12/2015 8:55:09 AM This report has been signed electronically.

## 2015-12-12 NOTE — Patient Instructions (Signed)
YOU HAD AN ENDOSCOPIC PROCEDURE TODAY AT THE  ENDOSCOPY CENTER:   Refer to the procedure report that was given to you for any specific questions about what was found during the examination.  If the procedure report does not answer your questions, please call your gastroenterologist to clarify.  If you requested that your care partner not be given the details of your procedure findings, then the procedure report has been included in a sealed envelope for you to review at your convenience later.  YOU SHOULD EXPECT: Some feelings of bloating in the abdomen. Passage of more gas than usual.  Walking can help get rid of the air that was put into your GI tract during the procedure and reduce the bloating. If you had a lower endoscopy (such as a colonoscopy or flexible sigmoidoscopy) you may notice spotting of blood in your stool or on the toilet paper. If you underwent a bowel prep for your procedure, you may not have a normal bowel movement for a few days.  Please Note:  You might notice some irritation and congestion in your nose or some drainage.  This is from the oxygen used during your procedure.  There is no need for concern and it should clear up in a day or so.  SYMPTOMS TO REPORT IMMEDIATELY:   Following lower endoscopy (colonoscopy or flexible sigmoidoscopy):  Excessive amounts of blood in the stool  Significant tenderness or worsening of abdominal pains  Swelling of the abdomen that is new, acute  Fever of 100F or higher   For urgent or emergent issues, a gastroenterologist can be reached at any hour by calling [phone].   DIET:  We do recommend a small meal at first, but then you may proceed to your regular diet.  Drink plenty of fluids but you should avoid alcoholic beverages for 24 hours.  ACTIVITY:  You should plan to take it easy for the rest of today and you should NOT DRIVE or use heavy machinery until tomorrow (because of the sedation medicines used during the test).     FOLLOW UP: Our staff will call the number listed on your records the next business day following your procedure to check on you and address any questions or concerns that you may have regarding the information given to you following your procedure. If we do not reach you, we will leave a message.  However, if you are feeling well and you are not experiencing any problems, there is no need to return our call.  We will assume that you have returned to your regular daily activities without incident.  If any biopsies were taken you will be contacted by phone or by letter within the next 1-3 weeks.  Please call us at [phone] if you have not heard about the biopsies in 3 weeks.    SIGNATURES/CONFIDENTIALITY: You and/or your care partner have signed paperwork which will be entered into your electronic medical record.  These signatures attest to the fact that that the information above on your After Visit Summary has been reviewed and is understood.  Full responsibility of the confidentiality of this discharge information lies with you and/or your care-partner.  Normal exam.  Repeat colonoscopy in 10 years 2027.  

## 2015-12-12 NOTE — Progress Notes (Signed)
Report to PACU, RN, vss, BBS= Clear.  

## 2015-12-15 ENCOUNTER — Telehealth: Payer: Self-pay | Admitting: *Deleted

## 2015-12-15 ENCOUNTER — Telehealth: Payer: Self-pay

## 2015-12-15 NOTE — Telephone Encounter (Signed)
  Follow up Call-  Call back number 12/12/2015  Post procedure Call Back phone  # [phone]  Permission to leave phone message Yes  Some recent data might be hidden    Patient was called for follow up after his procedure on 12/12/2015. No answer at the number given for follow up phone call. A message was left on the answering machine. This was the second attempt to contact the patient.

## 2015-12-15 NOTE — Telephone Encounter (Signed)
Message left

## 2015-12-16 ENCOUNTER — Telehealth: Payer: Self-pay

## 2015-12-16 NOTE — Telephone Encounter (Signed)
  Follow up Call-  Call back number 12/12/2015  Post procedure Call Back phone  # [phone]  Permission to leave phone message Yes  Some recent data might be hidden     Patient questions:  Do you have a fever, pain , or abdominal swelling? No. Pain Score  0 *  Have you tolerated food without any problems? Yes.    Have you been able to return to your normal activities? Yes.    Do you have any questions about your discharge instructions: Diet   No. Medications  No. Follow up visit  No.  Do you have questions or concerns about your Care? No.  Actions: * If pain score is 4 or above: No action needed, pain <4.

## 2016-01-13 ENCOUNTER — Ambulatory Visit: Payer: BLUE CROSS/BLUE SHIELD | Admitting: Family Medicine

## 2016-02-08 ENCOUNTER — Other Ambulatory Visit: Payer: Self-pay | Admitting: Family Medicine

## 2016-02-27 ENCOUNTER — Other Ambulatory Visit: Payer: Self-pay | Admitting: Family Medicine

## 2016-03-02 ENCOUNTER — Other Ambulatory Visit: Payer: Self-pay | Admitting: Family Medicine

## 2016-03-04 ENCOUNTER — Other Ambulatory Visit: Payer: Self-pay | Admitting: Family Medicine

## 2016-03-04 DIAGNOSIS — E118 Type 2 diabetes mellitus with unspecified complications: Secondary | ICD-10-CM

## 2016-03-31 ENCOUNTER — Ambulatory Visit (INDEPENDENT_AMBULATORY_CARE_PROVIDER_SITE_OTHER): Payer: BLUE CROSS/BLUE SHIELD | Admitting: Medical

## 2016-03-31 ENCOUNTER — Encounter: Payer: Self-pay | Admitting: Medical

## 2016-03-31 ENCOUNTER — Ambulatory Visit
Admission: RE | Admit: 2016-03-31 | Discharge: 2016-03-31 | Disposition: A | Discharge status: Home / Self Care | Payer: BLUE CROSS/BLUE SHIELD | Source: Ambulatory Visit | Attending: Medical | Admitting: Medical

## 2016-03-31 VITALS — BP 112/68 | HR 98 | Temp 98.8°F | Wt 214.2 lb

## 2016-03-31 DIAGNOSIS — R0602 Shortness of breath: Secondary | ICD-10-CM | POA: Diagnosis not present

## 2016-03-31 DIAGNOSIS — R05 Cough: Secondary | ICD-10-CM

## 2016-03-31 DIAGNOSIS — R059 Cough, unspecified: Secondary | ICD-10-CM

## 2016-03-31 DIAGNOSIS — J111 Influenza due to unidentified influenza virus with other respiratory manifestations: Secondary | ICD-10-CM | POA: Diagnosis not present

## 2016-03-31 DIAGNOSIS — R6889 Other general symptoms and signs: Secondary | ICD-10-CM

## 2016-03-31 LAB — POC INFLUENZA A&B (BINAX/QUICKVUE)
Influenza A, POC: NEGATIVE
Influenza B, POC: POSITIVE — AB

## 2016-03-31 MED ORDER — HYDROCODONE-HOMATROPINE 5-1.5 MG/5ML PO SYRP: 5.0000 mL | ORAL_SOLUTION | Freq: Three times a day (TID) | ORAL | 0 refills | Status: DC | PRN

## 2016-03-31 NOTE — Progress Notes (Signed)
Subjective:  Paul Long is a 60 y.o. male who presents for  Chief Complaint  Patient presents with  . coughing,congestion    coughing ,congestion ,sore throat    Feels lousy.  Has cough, productive cough, sore throat, fatigue.   Not sure about fever, thinks he has.   Some body aches, chills, nasal congestion.  Started 5 days ago with dry cough, worsened over the next few days.   Couldn't go to work last 2 days due to illness.  Using tylenol which helps a little.    Used some robitussin.   No wheezing, no SOB.   Nonsmoker.   No sick contacts.    No ear pain, no NVD.  No other aggravating or relieving factors. No other complaint.  The following portions of the patient's history were reviewed and updated as appropriate: allergies, current medications, past family history, past medical history, past social history, past surgical history and problem list.  ROS as in subjective  Past Medical History:  Diagnosis Date  . Allergy    RHINITIS  . Diabetes mellitus   . Dyslipidemia   . Gout   . Hyperlipidemia   . Hypertension   . Obesity     Current Outpatient Prescriptions on File Prior to Visit  Medication Sig Dispense Refill  . atorvastatin (LIPITOR) 20 MG tablet TAKE 1 TABLET (20 MG TOTAL) BY MOUTH DAILY. 90 tablet 3  . fish oil-omega-3 fatty acids 1000 MG capsule Take 2 g by mouth daily.      . FREESTYLE LITE test strip USE ONCE A DAY 50 each 3  . INVOKANA 300 MG TABS tablet TAKE 1 TABLET BY MOUTH DAILY BEFORE BREAKFAST 90 tablet 1  . Lancets (FREESTYLE) lancets USE AS DIRECTED 100 each 0  . lisinopril (PRINIVIL,ZESTRIL) 10 MG tablet Take 1 tablet (10 mg total) by mouth daily. 90 tablet 3  . Multiple Vitamins-Minerals (MULTIVITAMIN WITH MINERALS) tablet Take 1 tablet by mouth daily.    . pioglitazone-metformin (ACTOPLUS MET) 15-850 MG tablet TAKE 1 TABLET BY MOUTH 2 (TWO) TIMES DAILY. 180 tablet 1  . allopurinol (ZYLOPRIM) 300 MG tablet TAKE 1 TABLET BY MOUTH DAILY 90 tablet 3    Current Facility-Administered Medications on File Prior to Visit  Medication Dose Route Frequency Provider Last Rate Last Dose  . 0.9 %  sodium chloride infusion  500 mL Intravenous Continuous Malcolm T Stark, MD       ROS as in subjective  Objective: BP 112/68   Pulse 98   Temp 98.8 F (37.1 C)   Wt 214 lb 3.2 oz (97.2 kg)   SpO2 90%   BMI 29.87 kg/m   General appearance: Alert, WD/WN, no distress, ill appearing                             Skin: warm, no rash, no diaphoresis                           Head: no sinus tenderness                            Eyes: conjunctiva normal, corneas clear, PERRLA                            Ears: pearly TMs, external ear canals normal                            Nose: septum midline, turbinates swollen, with erythema and clear discharge             Mouth/throat: MMM, tongue normal, mild pharyngeal erythema                           Neck: supple, no adenopathy, no thyromegaly, nontender                          Heart: RRR, normal S1, S2, no murmurs                         Lungs: +bronchial breath sounds,decreased breath sounds, no rhonchi, no wheezes, no rales                Extremities: no edema, nontender      Assessment: Encounter Diagnoses  Name Primary?  . Cough Yes  . Flu-like symptoms   . Influenza      Plan:  Discussed diagnosis of influenza. Discussed supportive care including rest, hydration, OTC Tylenol or NSAID for fever, aches, and malaise.  Discussed period of contagion, self quarantine at home away from others to avoid spread of disease, discussed means of transmission, and possible complications including pneumonia.  Medication below prn, will send for CXR.  Patient voiced understanding of diagnosis, recommendations, and treatment plan.  Gave note for work  Ezrael was seen today for coughing,congestion.  Diagnoses and all orders for this visit:  Cough -     DG Chest 2 View; Future -     Pulse oximetry (single);  Future  Flu-like symptoms -     POC Influenza A&B(BINAX/QUICKVUE) -     DG Chest 2 View; Future -     Pulse oximetry (single); Future  Influenza -     Pulse oximetry (single); Future  Other orders -     HYDROcodone-homatropine (HYCODAN) 5-1.5 MG/5ML syrup; Take 5 mLs by mouth every 8 (eight) hours as needed for cough.     

## 2016-05-03 ENCOUNTER — Other Ambulatory Visit: Payer: Self-pay | Admitting: Family Medicine

## 2016-05-11 ENCOUNTER — Ambulatory Visit (INDEPENDENT_AMBULATORY_CARE_PROVIDER_SITE_OTHER): Payer: BLUE CROSS/BLUE SHIELD | Admitting: Family Medicine

## 2016-05-11 ENCOUNTER — Encounter: Payer: Self-pay | Admitting: Family Medicine

## 2016-05-11 VITALS — BP 114/60 | HR 78 | Ht 71.0 in | Wt 217.0 lb

## 2016-05-11 DIAGNOSIS — E1159 Type 2 diabetes mellitus with other circulatory complications: Secondary | ICD-10-CM | POA: Diagnosis not present

## 2016-05-11 DIAGNOSIS — I10 Essential (primary) hypertension: Secondary | ICD-10-CM

## 2016-05-11 DIAGNOSIS — E1169 Type 2 diabetes mellitus with other specified complication: Secondary | ICD-10-CM | POA: Diagnosis not present

## 2016-05-11 DIAGNOSIS — Z23 Encounter for immunization: Secondary | ICD-10-CM | POA: Diagnosis not present

## 2016-05-11 DIAGNOSIS — E669 Obesity, unspecified: Secondary | ICD-10-CM

## 2016-05-11 DIAGNOSIS — E785 Hyperlipidemia, unspecified: Secondary | ICD-10-CM

## 2016-05-11 DIAGNOSIS — E118 Type 2 diabetes mellitus with unspecified complications: Secondary | ICD-10-CM | POA: Diagnosis not present

## 2016-05-11 DIAGNOSIS — I152 Hypertension secondary to endocrine disorders: Secondary | ICD-10-CM

## 2016-05-11 LAB — POCT GLYCOSYLATED HEMOGLOBIN (HGB A1C): Hemoglobin A1C: 7.7

## 2016-05-11 LAB — POCT UA - MICROALBUMIN
Albumin/Creatinine Ratio, Urine, POC: <8.7
CREATININE, POC: 57.5 mg/dL
Microalbumin Ur, POC: <5 mg/L

## 2016-05-11 NOTE — Progress Notes (Signed)
  Subjective:  History and exam as above. Patient was seen in conjunction with me and the medical student. Epic was uncooperative in documenting adequately

## 2016-05-11 NOTE — Progress Notes (Signed)
Subjective:     Patient ID: Paul Long, male   DOB: Mar 09, 1955, 61 y.o.   MRN: [id number]  HPI Paul BlanksBarry W Long is a 61 year old male with a past medical history of type 2 diabetes, hypertension, hyperlipidemia, and obesity who presents for follow-up of Type 2 diabetes mellitus  Home blood sugar records: 150-210, patient tests once a day two hours after eating during the week, in the morning on the weekends Current symptoms/problems: none Daily foot checks: Yes   Any foot concerns: none Exercise: walking 30 minutes, 7 days a weeks Eye: Appointment scheduled for May 2018 Diet: regular, no changes [name] takes his medications as prescribed. He is on lisinopril for his hypertension and is well controlled on current regimen. He takes atorvostatin as prescribed for his hyperlipidemia. His last labs were 11/2015. We will repeat lipid panel when he comes for an annual exam in October 2018 to follow up on his hyperlipidemia. He is also on Invokana and pioglitazone-metformin for his diabetes and is experiencing no symptoms from that. His weight is stable at 217 today. At his last visit it was 214. Patient was counseled on appropriate on diet and exercise. He does not smoke and drinks alcohol sparingly.   Review of Systems     Objective:   Physical Exam Alert and in no distress otherwise not examined.     Assessment & Plan:    Hypertension associated with diabetes (HCC)  Type 2 diabetes mellitus with complication, without long-term current use of insulin (HCC)  Hyperlipidemia associated with type 2 diabetes mellitus (HCC)  Obesity (BMI 30-39.9)  Above diagnoses adequately controlled on current regimen. This was seen in conjunction with Dr. Susann GivensLalonde.

## 2016-06-07 ENCOUNTER — Other Ambulatory Visit: Payer: Self-pay | Admitting: Family Medicine

## 2016-06-22 ENCOUNTER — Encounter: Payer: Self-pay | Admitting: Family Medicine

## 2016-06-22 DIAGNOSIS — Z961 Presence of intraocular lens: Secondary | ICD-10-CM | POA: Diagnosis not present

## 2016-06-22 DIAGNOSIS — E119 Type 2 diabetes mellitus without complications: Secondary | ICD-10-CM | POA: Diagnosis not present

## 2016-06-22 DIAGNOSIS — H5213 Myopia, bilateral: Secondary | ICD-10-CM | POA: Diagnosis not present

## 2016-06-22 LAB — HM DIABETES EYE EXAM

## 2016-08-11 ENCOUNTER — Other Ambulatory Visit: Payer: Self-pay | Admitting: Family Medicine

## 2016-08-12 NOTE — Telephone Encounter (Signed)
Pt has an appt end of July 2018

## 2016-09-07 ENCOUNTER — Other Ambulatory Visit: Payer: Self-pay | Admitting: Family Medicine

## 2016-09-07 DIAGNOSIS — E118 Type 2 diabetes mellitus with unspecified complications: Secondary | ICD-10-CM

## 2016-09-07 NOTE — Telephone Encounter (Signed)
Pt has an upcoming appt

## 2016-09-14 ENCOUNTER — Ambulatory Visit (INDEPENDENT_AMBULATORY_CARE_PROVIDER_SITE_OTHER): Payer: BLUE CROSS/BLUE SHIELD | Admitting: Family Medicine

## 2016-09-14 ENCOUNTER — Encounter: Payer: Self-pay | Admitting: Family Medicine

## 2016-09-14 VITALS — BP 130/80 | HR 81 | Ht 71.0 in | Wt 217.0 lb

## 2016-09-14 DIAGNOSIS — E118 Type 2 diabetes mellitus with unspecified complications: Secondary | ICD-10-CM | POA: Diagnosis not present

## 2016-09-14 DIAGNOSIS — E669 Obesity, unspecified: Secondary | ICD-10-CM

## 2016-09-14 DIAGNOSIS — E1169 Type 2 diabetes mellitus with other specified complication: Secondary | ICD-10-CM

## 2016-09-14 DIAGNOSIS — E1159 Type 2 diabetes mellitus with other circulatory complications: Secondary | ICD-10-CM | POA: Diagnosis not present

## 2016-09-14 DIAGNOSIS — E785 Hyperlipidemia, unspecified: Secondary | ICD-10-CM | POA: Diagnosis not present

## 2016-09-14 DIAGNOSIS — I152 Hypertension secondary to endocrine disorders: Secondary | ICD-10-CM

## 2016-09-14 DIAGNOSIS — M1000 Idiopathic gout, unspecified site: Secondary | ICD-10-CM

## 2016-09-14 DIAGNOSIS — I10 Essential (primary) hypertension: Secondary | ICD-10-CM | POA: Diagnosis not present

## 2016-09-14 LAB — POCT GLYCOSYLATED HEMOGLOBIN (HGB A1C): HEMOGLOBIN A1C: 7

## 2016-09-14 NOTE — Progress Notes (Signed)
  Subjective:    Patient ID: Paul Long, male    DOB: 09-Jul-1955, 61 y.o.   MRN: [id number]  Paul BlanksBarry W Long is a 61 y.o. male who presents for follow-up of Type 2 diabetes mellitus.  Patient IS checking home blood sugars.   Home blood sugar records: AVG 130 TO 190 How often is blood sugars being checked: BID Current symptoms/problems NONE Daily foot checks: YES   Any foot concerns: NONE Last eye exam: 06/22/16 Exercise: WALKING  He continues on Actos plus as well as Invokana and is having no difficulty with that. He is also taking Lipitor and having no aches or pains. Does have underlying gout and is on allopurinol and again doing quite nicely on that with no joint aches or pains.. Taking lisinopril for his blood pressure. His eating habits are slightly erratic but he states he has made some dietary changes and is walking more than in the past.   The following portions of the patient's history were reviewed and updated as appropriate: allergies, current medications, past medical history, past social history and problem list.  ROS as in subjective above.     Objective:    Physical Exam Alert and in no distress otherwise not examined.  Lab Review Diabetic Labs Latest Ref Rng & Units 09/14/2016 05/11/2016 12/05/2015 09/09/2015 05/02/2015  HbA1c - 7.0 7.7 7.5 7.2 8 2   Microalbumin mg/L - <5.0 - - -  Micro/Creat Ratio - - <8.7 - - -  Chol 125 - 200 mg/dL - - 578125 - -  HDL >=46>=40 mg/dL - - 44 - -  Calc LDL <962<130 mg/dL - - 54 - -  Triglycerides <150 mg/dL - - 952136 - -  Creatinine 0.70 - 1.25 mg/dL - - 8.410.95 - -   BP/Weight 09/14/2016 05/11/2016 03/31/2016 12/12/2015 12/05/2015  Systolic BP 130 114 112 107 100  Diastolic BP 80 60 68 69 70  Wt. (Lbs) 217 217 214.2 219 220  BMI 30.27 30.27 29.87 30.54 29.03   Foot/eye exam completion dates Latest Ref Rng & Units 06/22/2016 01/13/2015  Eye Exam No Retinopathy No Retinopathy -  Foot Form Completion - - Done  A1c is 7.2  Paul Long  reports that he  has never smoked. He has never used smokeless tobacco. He reports that he does not drink alcohol or use drugs.     Assessment & Plan:    Type 2 diabetes mellitus with complication, without long-term current use of insulin (HCC) - Plan: HgB A1c  Hyperlipidemia associated with type 2 diabetes mellitus (HCC)  Hypertension associated with diabetes (HCC)  Obesity (BMI 30-39.9)  Idiopathic gout, unspecified chronicity, unspecified site  1. Rx changes: none 2. Education: Reviewed 'ABCs' of diabetes management (respective goals in parentheses):  A1C (<7), blood pressure (<130/80), and cholesterol (LDL <100). 3. Compliance at present is estimated to be good. Efforts to improve compliance (if necessary) will be directed at continuing with his present diet and exercise regimen but did talk to him about having more frequent but smaller meals.. 4. Follow up: 4 months He'll continue on his present medication regimen for treatment of the above diagnoses

## 2016-10-21 ENCOUNTER — Telehealth: Payer: Self-pay | Admitting: Internal Medicine

## 2016-10-21 NOTE — Telephone Encounter (Signed)
Called and left message that he is due for his 2nd shingrix shot. His 6 month mark is up at 11/11/16. He has to come in before then

## 2016-11-04 ENCOUNTER — Other Ambulatory Visit (INDEPENDENT_AMBULATORY_CARE_PROVIDER_SITE_OTHER): Payer: BLUE CROSS/BLUE SHIELD

## 2016-11-04 DIAGNOSIS — Z23 Encounter for immunization: Secondary | ICD-10-CM

## 2016-11-09 ENCOUNTER — Other Ambulatory Visit: Payer: Self-pay | Admitting: Family Medicine

## 2016-12-04 ENCOUNTER — Other Ambulatory Visit: Payer: Self-pay | Admitting: Family Medicine

## 2016-12-04 DIAGNOSIS — E118 Type 2 diabetes mellitus with unspecified complications: Secondary | ICD-10-CM

## 2017-01-18 ENCOUNTER — Ambulatory Visit: Payer: BLUE CROSS/BLUE SHIELD | Admitting: Family Medicine

## 2017-02-05 DIAGNOSIS — J22 Unspecified acute lower respiratory infection: Secondary | ICD-10-CM | POA: Diagnosis not present

## 2017-02-21 ENCOUNTER — Other Ambulatory Visit: Payer: Self-pay | Admitting: Family Medicine

## 2017-03-05 ENCOUNTER — Other Ambulatory Visit: Payer: Self-pay | Admitting: Family Medicine

## 2017-03-05 DIAGNOSIS — E118 Type 2 diabetes mellitus with unspecified complications: Secondary | ICD-10-CM

## 2017-03-21 ENCOUNTER — Other Ambulatory Visit: Payer: Self-pay | Admitting: Family Medicine

## 2017-03-21 DIAGNOSIS — E785 Hyperlipidemia, unspecified: Principal | ICD-10-CM

## 2017-03-21 DIAGNOSIS — E1169 Type 2 diabetes mellitus with other specified complication: Secondary | ICD-10-CM

## 2017-03-28 ENCOUNTER — Ambulatory Visit: Payer: BLUE CROSS/BLUE SHIELD | Admitting: Family Medicine

## 2017-03-28 ENCOUNTER — Encounter: Payer: Self-pay | Admitting: Family Medicine

## 2017-03-28 VITALS — BP 128/84 | HR 102 | Wt 209.0 lb

## 2017-03-28 DIAGNOSIS — E1169 Type 2 diabetes mellitus with other specified complication: Secondary | ICD-10-CM | POA: Diagnosis not present

## 2017-03-28 DIAGNOSIS — E1159 Type 2 diabetes mellitus with other circulatory complications: Secondary | ICD-10-CM

## 2017-03-28 DIAGNOSIS — B9789 Other viral agents as the cause of diseases classified elsewhere: Secondary | ICD-10-CM

## 2017-03-28 DIAGNOSIS — I10 Essential (primary) hypertension: Secondary | ICD-10-CM | POA: Diagnosis not present

## 2017-03-28 DIAGNOSIS — E669 Obesity, unspecified: Secondary | ICD-10-CM | POA: Diagnosis not present

## 2017-03-28 DIAGNOSIS — E118 Type 2 diabetes mellitus with unspecified complications: Secondary | ICD-10-CM | POA: Diagnosis not present

## 2017-03-28 DIAGNOSIS — E785 Hyperlipidemia, unspecified: Secondary | ICD-10-CM

## 2017-03-28 DIAGNOSIS — M1000 Idiopathic gout, unspecified site: Secondary | ICD-10-CM

## 2017-03-28 DIAGNOSIS — I152 Hypertension secondary to endocrine disorders: Secondary | ICD-10-CM

## 2017-03-28 DIAGNOSIS — J069 Acute upper respiratory infection, unspecified: Secondary | ICD-10-CM

## 2017-03-28 LAB — POCT UA - MICROALBUMIN
ALBUMIN/CREATININE RATIO, URINE, POC: 6.8
CREATININE, POC: 88.4 mg/dL
Microalbumin Ur, POC: 5.7 mg/L

## 2017-03-28 LAB — POCT GLYCOSYLATED HEMOGLOBIN (HGB A1C): HEMOGLOBIN A1C: 6.8

## 2017-03-28 NOTE — Progress Notes (Signed)
  Subjective:    Patient ID: Paul Long, male    DOB: 08/19/1955, 62 y.o.   MRN: [id number]  Paul Long is a 62 y.o. male who presents for follow-up of Type 2 diabetes mellitus.  Patient is  checking home blood sugars.   Home blood sugar records: 140-160 How often is blood sugars being checked: qd Current symptoms/problems include none and have been unchanged. Daily foot checks: yes   Any foot concerns: no Last eye exam: last year Qd for 30 min walking He has a 3-day history of nasal congestion, slight sore throat, sneezing and cough but no fever, chills, earache.  He continues on his atorvastatin as well as Invokana, Actos plus.  He also is taking allopurinol had no difficulty with them.  Continues on lisinopril.   The following portions of the patient's history were reviewed and updated as appropriate: allergies, current medications, past medical history, past social history and problem list.  ROS as in subjective above.     Objective:    Physical Exam Alert and in no distress. Tympanic membranes and canals are normal. Pharyngeal area is normal. Neck is supple without adenopathy or thyromegaly. Cardiac exam shows a regular sinus rhythm without murmurs or gallops. Lungs are clear to auscultation.  foot exam is normal   Lab Review Diabetic Labs Latest Ref Rng & Units 09/14/2016 05/11/2016 12/05/2015 09/09/2015 05/02/2015  HbA1c - 7.0 7.7 7.5 7.2 8 2   Microalbumin mg/L - <5.0 - - -  Micro/Creat Ratio - - <8.7 - - -  Chol 125 - 200 mg/dL - - 409125 - -  HDL >=81>=40 mg/dL - - 44 - -  Calc LDL <191<130 mg/dL - - 54 - -  Triglycerides <150 mg/dL - - 478136 - -  Creatinine 0.70 - 1.25 mg/dL - - 2.950.95 - -   BP/Weight 09/14/2016 05/11/2016 03/31/2016 12/12/2015 12/05/2015  Systolic BP 130 114 112 107 100  Diastolic BP 80 60 68 69 70  Wt. (Lbs) 217 217 214.2 219 220  BMI 30.27 30.27 29.87 30.54 29.03   Foot/eye exam completion dates Latest Ref Rng & Units 06/22/2016 01/13/2015  Eye Exam No  Retinopathy No Retinopathy -  Foot Form Completion - - Done  A1c is 6.8  Paul Long  reports that  has never smoked. he has never used smokeless tobacco. He reports that he does not drink alcohol or use drugs.     Assessment & Plan:    Type 2 diabetes mellitus with complication, without long-term current use of insulin (HCC) - Plan: CBC with Differential/Platelet, Comprehensive metabolic panel, Lipid panel, POCT UA - Microalbumin  Hyperlipidemia associated with type 2 diabetes mellitus (HCC) - Plan: Lipid panel  Hypertension associated with diabetes (HCC) - Plan: CBC with Differential/Platelet, Comprehensive metabolic panel  Obesity (BMI 30-39.9) - Plan: CBC with Differential/Platelet, Comprehensive metabolic panel, Lipid panel  Idiopathic gout, unspecified chronicity, unspecified site - Plan: Uric Acid  Viral URI with cough    1. Rx changes: none 2. Education: Reviewed 'ABCs' of diabetes management (respective goals in parentheses):  A1C (<7), blood pressure (<130/80), and cholesterol (LDL <100). 3. Compliance at present is estimated to be good. Efforts to improve compliance (if necessary) will be directed at increased exercise. 4. Follow up: 4 months Supportive care for the URI.  Did recommend using Afrin nasal spray at night.  Congratulated him on his weight loss.

## 2017-03-29 LAB — LIPID PANEL
CHOL/HDL RATIO: 3.2 ratio (ref 0.0–5.0)
Cholesterol, Total: 135 mg/dL (ref 100–199)
HDL: 42 mg/dL (ref >39)
LDL Calculated: 60 mg/dL (ref 0–99)
Triglycerides: 165 mg/dL — ABNORMAL HIGH (ref 0–149)
VLDL Cholesterol Cal: 33 mg/dL (ref 5–40)

## 2017-03-29 LAB — CBC WITH DIFFERENTIAL/PLATELET
BASOS: 1 %
Basophils Absolute: 0.1 10*3/uL (ref 0.0–0.2)
EOS (ABSOLUTE): 0.2 10*3/uL (ref 0.0–0.4)
EOS: 2 %
HEMATOCRIT: 45.8 % (ref 37.5–51.0)
HEMOGLOBIN: 15.8 g/dL (ref 13.0–17.7)
IMMATURE GRANS (ABS): 0 10*3/uL (ref 0.0–0.1)
Immature Granulocytes: 0 %
LYMPHS: 21 %
Lymphocytes Absolute: 1.8 10*3/uL (ref 0.7–3.1)
MCH: 31.8 pg (ref 26.6–33.0)
MCHC: 34.5 g/dL (ref 31.5–35.7)
MCV: 92 fL (ref 79–97)
Monocytes Absolute: 0.9 10*3/uL (ref 0.1–0.9)
Monocytes: 11 %
NEUTROS ABS: 5.6 10*3/uL (ref 1.4–7.0)
Neutrophils: 65 %
PLATELETS: 268 10*3/uL (ref 150–379)
RBC: 4.97 x10E6/uL (ref 4.14–5.80)
RDW: 14.6 % (ref 12.3–15.4)
WBC: 8.5 10*3/uL (ref 3.4–10.8)

## 2017-03-29 LAB — COMPREHENSIVE METABOLIC PANEL
A/G RATIO: 1.7 (ref 1.2–2.2)
ALBUMIN: 4.7 g/dL (ref 3.6–4.8)
ALT: 32 IU/L (ref 0–44)
AST: 20 IU/L (ref 0–40)
Alkaline Phosphatase: 114 IU/L (ref 39–117)
BUN / CREAT RATIO: 16 (ref 10–24)
BUN: 14 mg/dL (ref 8–27)
Bilirubin Total: 0.9 mg/dL (ref 0.0–1.2)
CALCIUM: 9.8 mg/dL (ref 8.6–10.2)
CHLORIDE: 100 mmol/L (ref 96–106)
CO2: 22 mmol/L (ref 20–29)
Creatinine, Ser: 0.88 mg/dL (ref 0.76–1.27)
GFR, EST AFRICAN AMERICAN: 107 mL/min/{1.73_m2} (ref >59)
GFR, EST NON AFRICAN AMERICAN: 93 mL/min/{1.73_m2} (ref >59)
GLOBULIN, TOTAL: 2.8 g/dL (ref 1.5–4.5)
Glucose: 143 mg/dL — ABNORMAL HIGH (ref 65–99)
Potassium: 4.6 mmol/L (ref 3.5–5.2)
Sodium: 140 mmol/L (ref 134–144)
TOTAL PROTEIN: 7.5 g/dL (ref 6.0–8.5)

## 2017-03-29 LAB — URIC ACID: Uric Acid: 3.3 mg/dL — ABNORMAL LOW (ref 3.7–8.6)

## 2017-05-05 ENCOUNTER — Other Ambulatory Visit: Payer: Self-pay | Admitting: Family Medicine

## 2017-05-05 DIAGNOSIS — E118 Type 2 diabetes mellitus with unspecified complications: Secondary | ICD-10-CM

## 2017-05-18 ENCOUNTER — Other Ambulatory Visit: Payer: Self-pay | Admitting: Family Medicine

## 2017-05-24 ENCOUNTER — Other Ambulatory Visit: Payer: Self-pay | Admitting: Medical

## 2017-05-26 ENCOUNTER — Other Ambulatory Visit: Payer: Self-pay | Admitting: Medical

## 2017-05-29 ENCOUNTER — Other Ambulatory Visit: Payer: Self-pay | Admitting: Family Medicine

## 2017-06-28 DIAGNOSIS — H26493 Other secondary cataract, bilateral: Secondary | ICD-10-CM | POA: Diagnosis not present

## 2017-06-28 DIAGNOSIS — E119 Type 2 diabetes mellitus without complications: Secondary | ICD-10-CM | POA: Diagnosis not present

## 2017-06-28 DIAGNOSIS — H5213 Myopia, bilateral: Secondary | ICD-10-CM | POA: Diagnosis not present

## 2017-06-28 LAB — HM DIABETES EYE EXAM

## 2017-07-10 ENCOUNTER — Other Ambulatory Visit: Payer: Self-pay | Admitting: Family Medicine

## 2017-07-10 DIAGNOSIS — E118 Type 2 diabetes mellitus with unspecified complications: Secondary | ICD-10-CM

## 2017-07-24 ENCOUNTER — Other Ambulatory Visit: Payer: Self-pay | Admitting: Family Medicine

## 2017-07-25 ENCOUNTER — Telehealth: Payer: Self-pay | Admitting: Family Medicine

## 2017-07-25 ENCOUNTER — Other Ambulatory Visit: Payer: Self-pay | Admitting: Family Medicine

## 2017-07-25 MED ORDER — GLUCOSE BLOOD VI STRP: ORAL_STRIP | 2 refills | Status: DC

## 2017-07-25 NOTE — Telephone Encounter (Signed)
done

## 2017-07-25 NOTE — Telephone Encounter (Signed)
[facility] [employer] [facility]

## 2017-07-26 ENCOUNTER — Telehealth: Payer: Self-pay | Admitting: Internal Medicine

## 2017-07-26 ENCOUNTER — Encounter: Payer: Self-pay | Admitting: Family Medicine

## 2017-07-26 ENCOUNTER — Ambulatory Visit (INDEPENDENT_AMBULATORY_CARE_PROVIDER_SITE_OTHER): Payer: BLUE CROSS/BLUE SHIELD | Admitting: Family Medicine

## 2017-07-26 VITALS — BP 120/78 | HR 84 | Temp 98.0°F | Ht 71.0 in | Wt 216.8 lb

## 2017-07-26 DIAGNOSIS — I10 Essential (primary) hypertension: Secondary | ICD-10-CM

## 2017-07-26 DIAGNOSIS — E1159 Type 2 diabetes mellitus with other circulatory complications: Secondary | ICD-10-CM

## 2017-07-26 DIAGNOSIS — E118 Type 2 diabetes mellitus with unspecified complications: Secondary | ICD-10-CM | POA: Diagnosis not present

## 2017-07-26 DIAGNOSIS — E669 Obesity, unspecified: Secondary | ICD-10-CM

## 2017-07-26 DIAGNOSIS — Z125 Encounter for screening for malignant neoplasm of prostate: Secondary | ICD-10-CM | POA: Diagnosis not present

## 2017-07-26 DIAGNOSIS — M1000 Idiopathic gout, unspecified site: Secondary | ICD-10-CM | POA: Diagnosis not present

## 2017-07-26 DIAGNOSIS — E1169 Type 2 diabetes mellitus with other specified complication: Secondary | ICD-10-CM

## 2017-07-26 DIAGNOSIS — I152 Hypertension secondary to endocrine disorders: Secondary | ICD-10-CM

## 2017-07-26 DIAGNOSIS — Z0000 Encounter for general adult medical examination without abnormal findings: Secondary | ICD-10-CM | POA: Diagnosis not present

## 2017-07-26 DIAGNOSIS — E785 Hyperlipidemia, unspecified: Secondary | ICD-10-CM | POA: Diagnosis not present

## 2017-07-26 MED ORDER — GLUCOSE BLOOD VI STRP: ORAL_STRIP | 3 refills | Status: DC

## 2017-07-26 NOTE — Progress Notes (Signed)
   Subjective:    Patient ID: Paul BlanksBarry W Long, male    DOB: Jul 24, 1955, 62 y.o.   MRN: [id number]  HPI He is here for complete examination.  He does have underlying diabetes and was seen in February.  He states his blood sugars run between 140 and 160.  He does admit to some dietary indiscretion.  Does check his feet regularly.  He was seen by ophthalmology in May.  He exercises mainly with working which requires a lot of walking.  His feet are doing fine.  He does not smoke or drink.  He is taking Invokana, Actos plus for his diabetes and having no trouble with that.  He continues on allopurinol.  Is also taking lisinopril for his blood pressure and having no difficulties with that.  Family and social history as well as health maintenance and immunizations was reviewed   Review of Systems  All other systems reviewed and are negative.      Objective:   Physical Exam BP 120/78 (BP Location: Left Arm, Patient Position: Sitting)   Pulse 84   Temp 98 F (36.7 C)   Ht 5\' 11"  (1.803 m)   Wt 216 lb 12.8 oz (98.3 kg)   SpO2 94%   BMI 30.24 kg/m   General Appearance:    Alert, cooperative, no distress, appears stated age  Head:    Normocephalic, without obvious abnormality, atraumatic  Eyes:    PERRL, conjunctiva/corneas clear, EOM's intact, fundi    benign  Ears:    Normal TM's and external ear canals  Nose:   Nares normal, mucosa normal, no drainage or sinus   tenderness  Throat:   Lips, mucosa, and tongue normal; teeth and gums normal  Neck:   Supple, no lymphadenopathy;  thyroid:  no   enlargement/tenderness/nodules; no carotid   bruit or JVD     Lungs:     Clear to auscultation bilaterally without wheezes, rales or     ronchi; respirations unlabored      Heart:    Regular rate and rhythm, S1 and S2 normal, no murmur, rub   or gallop     Abdomen:     Soft, non-tender, nondistended, normoactive bowel sounds,    no masses, no hepatosplenomegaly  Genitalia:   Deferred      Rectal:  Deferred   Extremities:   No clubbing, cyanosis or edema  Pulses:   2+ and symmetric all extremities  Skin:   Skin color, texture, turgor normal, no rashes or lesions  Lymph nodes:   Cervical, supraclavicular, and axillary nodes normal  Neurologic:   CNII-XII intact, normal strength, sensation and gait; reflexes 2+ and symmetric throughout          Psych:   Normal mood, affect, hygiene and grooming.   A1c is 7.4 Previous blood work was evaluated.       Assessment & Plan:  Routine general medical examination at a health care facility  Type 2 diabetes mellitus with complication, without Long-term current use of insulin (HCC)  Hypertension associated with diabetes (HCC)  Obesity (BMI 30-39.9)  Idiopathic gout, unspecified chronicity, unspecified site  Hyperlipidemia associated with type 2 diabetes mellitus (HCC)  Screening for prostate cancer - Plan: PSA Encouraged him to make further diet changes as his weight is up from his last visit.  Encouraged him to stay as physically active as possible.  Continue on present medication regimen.

## 2017-07-26 NOTE — Telephone Encounter (Signed)
Pt needed a refill on freestyle lite test strips

## 2017-07-27 LAB — PSA: Prostate Specific Ag, Serum: 0.3 ng/mL (ref 0.0–4.0)

## 2017-09-08 ENCOUNTER — Other Ambulatory Visit: Payer: Self-pay | Admitting: Family Medicine

## 2017-09-08 DIAGNOSIS — E118 Type 2 diabetes mellitus with unspecified complications: Secondary | ICD-10-CM

## 2017-11-05 IMAGING — DX DG CHEST 2V
2 series · 2 of 2 positions shown · non-contrast
Comparison: None in PACs

CLINICAL DATA: Productive cough and shortness of breath over the
past 5 days. Positive flu test. History of diabetes and hypertension

EXAM:
CHEST  2 VIEW

[dg chest 2 view (1 of 2)]
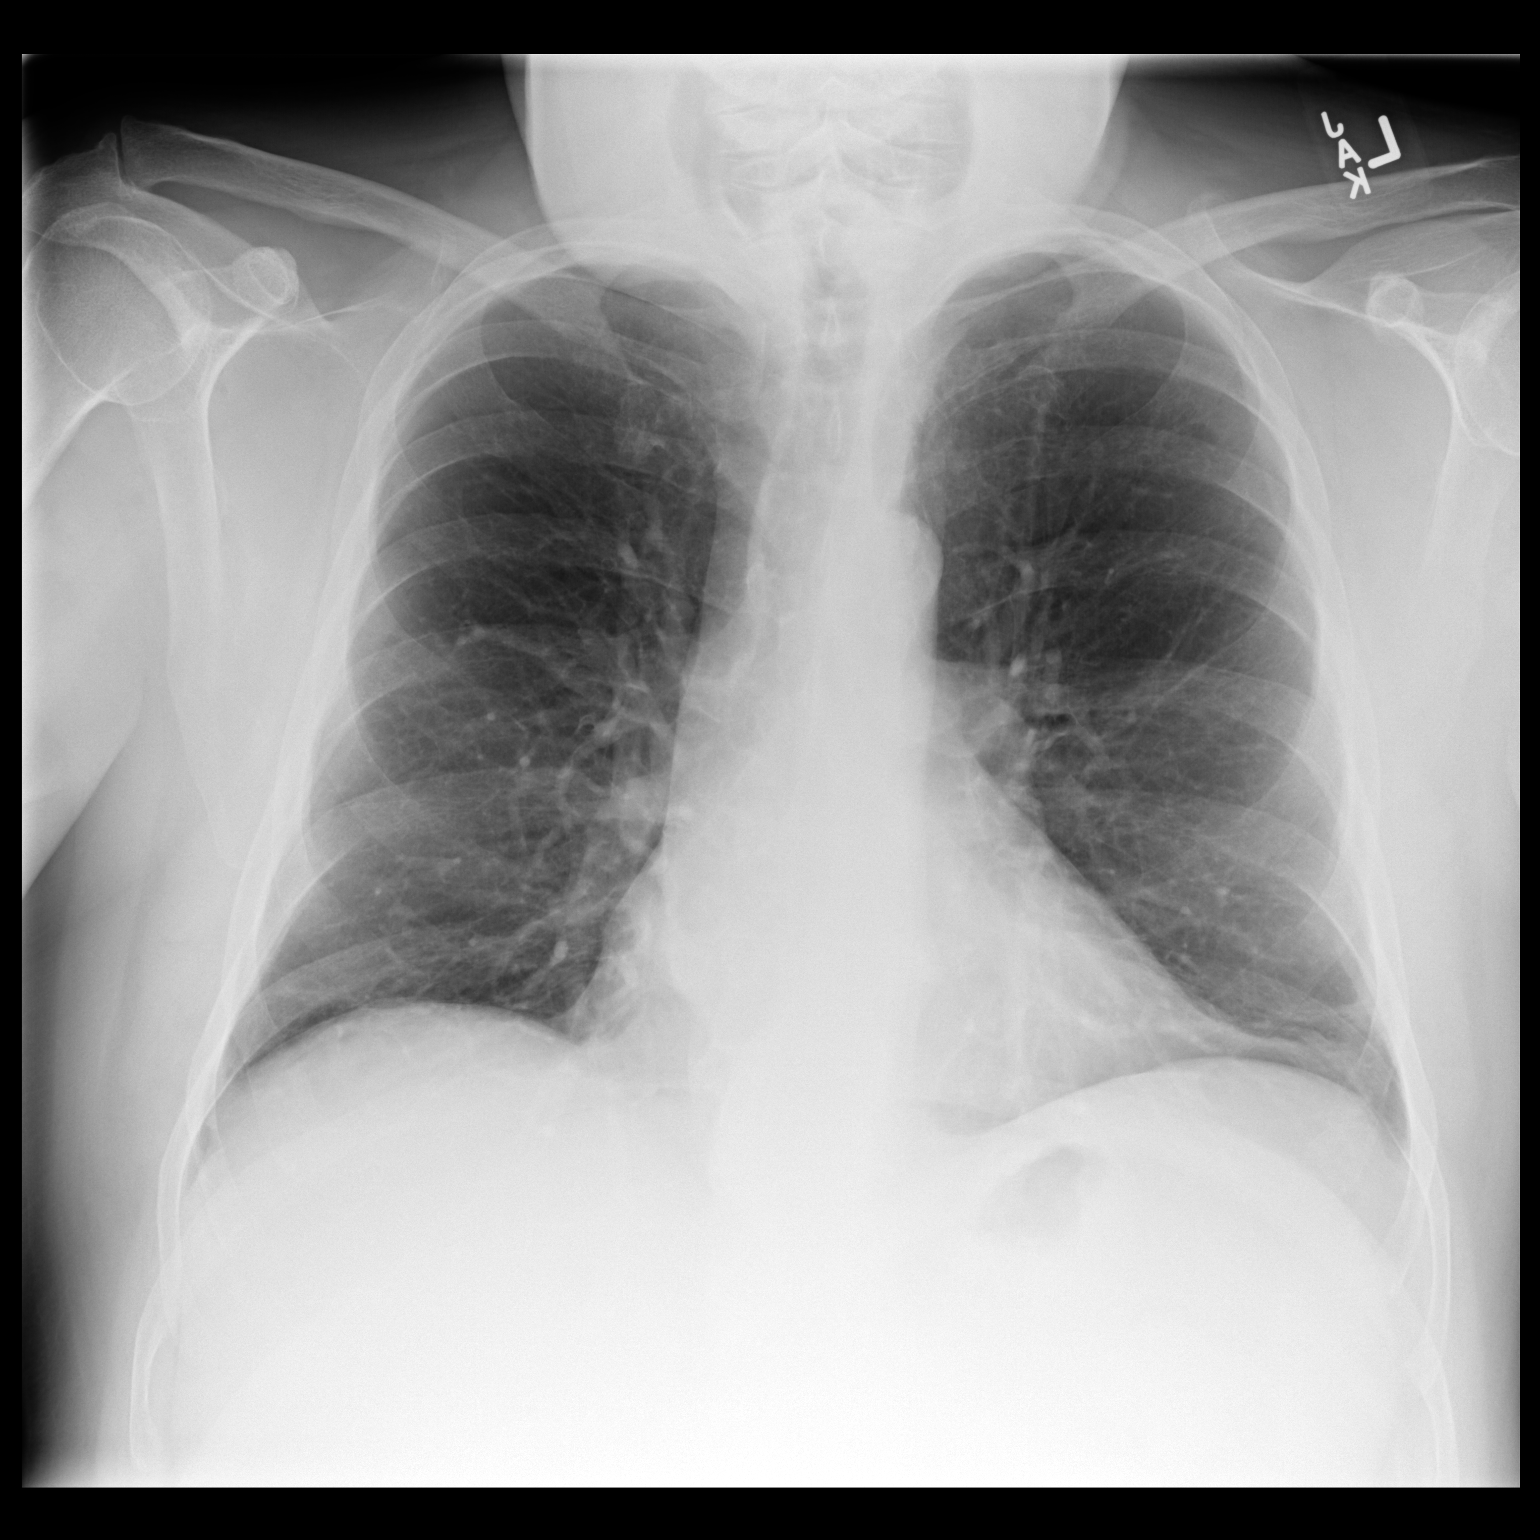

[dg chest 2 view (2 of 2)]
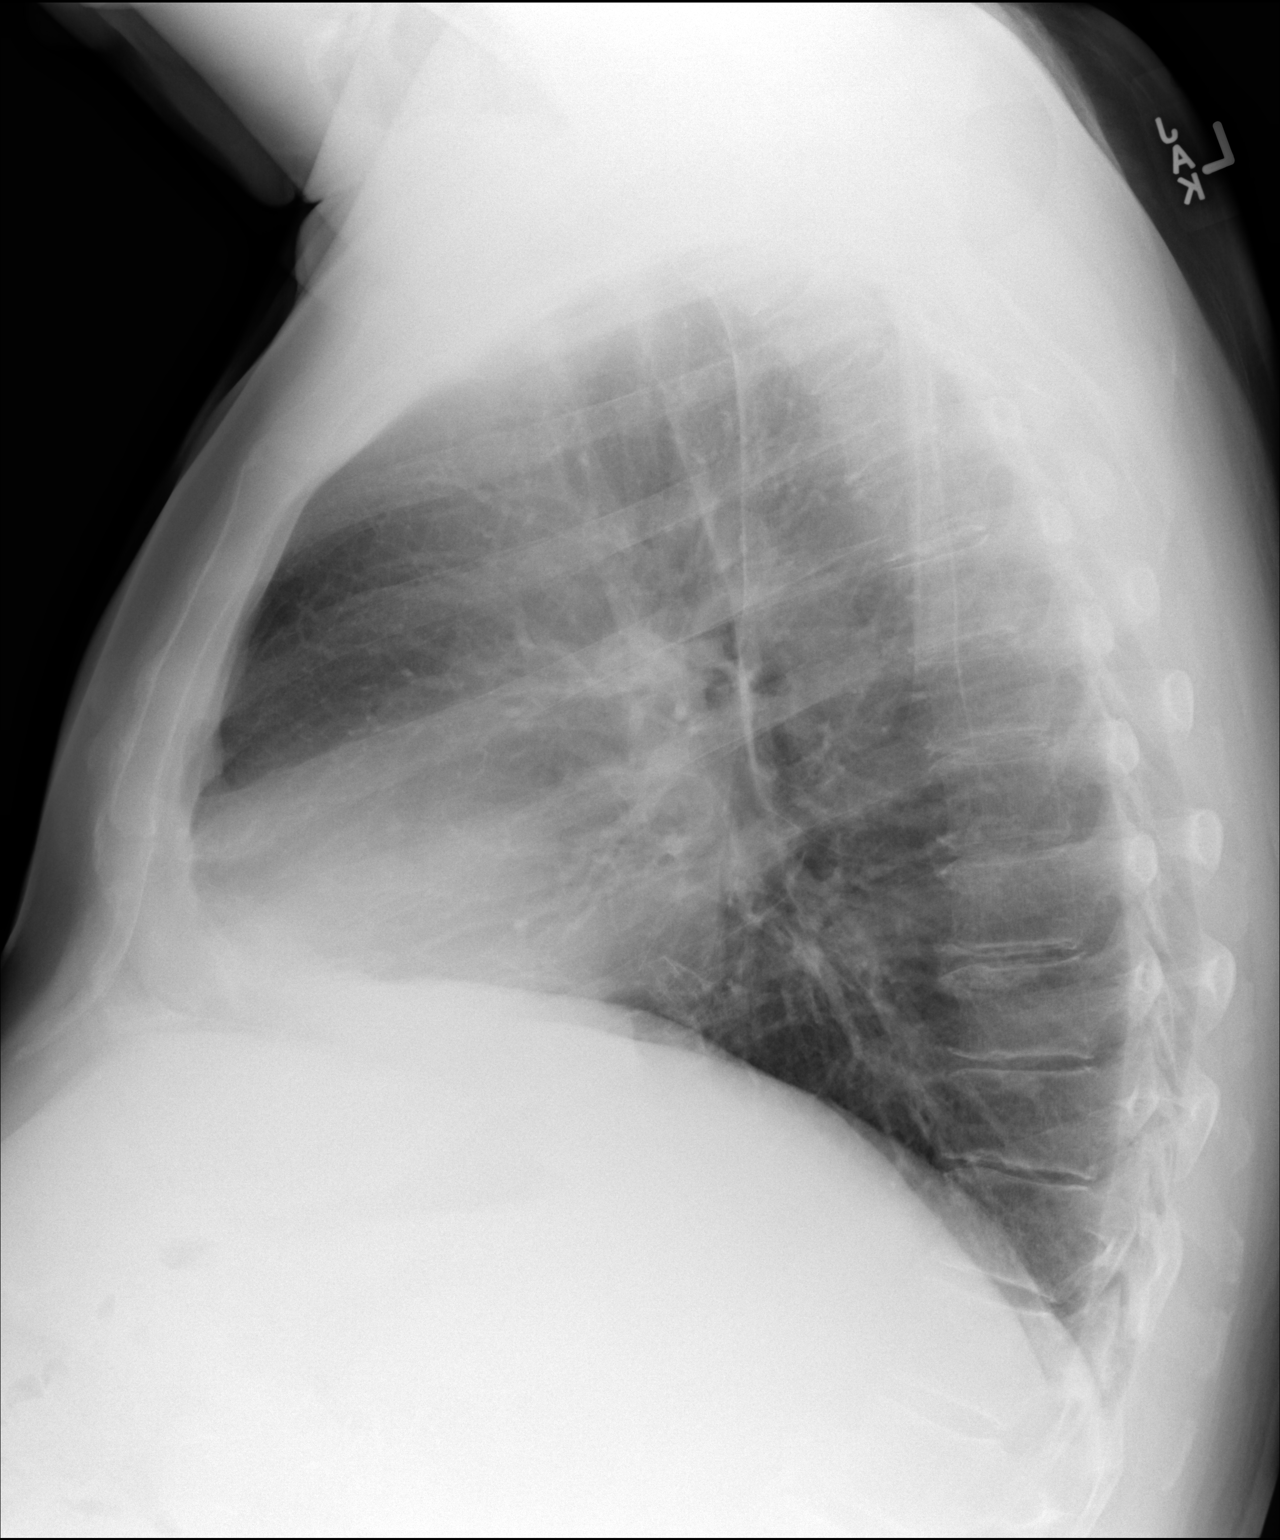

[2 of 2 positions shown; findings below may reference images not displayed]

FINDINGS: The lungs are adequately inflated and clear. Density laterally at
the left lung base likely corresponds to the epicardial fat pad on
the lateral view. The heart and pulmonary vascularity are normal.
The mediastinum is normal in width. There is no pleural effusion.
The bony thorax exhibits no acute abnormality.
IMPRESSION: There is no pneumonia nor other acute cardiopulmonary abnormality.

## 2017-11-10 ENCOUNTER — Other Ambulatory Visit: Payer: Self-pay | Admitting: Family Medicine

## 2017-11-10 DIAGNOSIS — E118 Type 2 diabetes mellitus with unspecified complications: Secondary | ICD-10-CM

## 2017-11-24 ENCOUNTER — Other Ambulatory Visit (INDEPENDENT_AMBULATORY_CARE_PROVIDER_SITE_OTHER): Payer: BLUE CROSS/BLUE SHIELD

## 2017-11-24 DIAGNOSIS — Z23 Encounter for immunization: Secondary | ICD-10-CM | POA: Diagnosis not present

## 2017-12-01 ENCOUNTER — Other Ambulatory Visit: Payer: Self-pay | Admitting: Family Medicine

## 2017-12-13 ENCOUNTER — Ambulatory Visit: Payer: BLUE CROSS/BLUE SHIELD | Admitting: Family Medicine

## 2017-12-27 ENCOUNTER — Ambulatory Visit: Payer: BLUE CROSS/BLUE SHIELD | Admitting: Family Medicine

## 2018-01-04 ENCOUNTER — Other Ambulatory Visit: Payer: Self-pay | Admitting: Family Medicine

## 2018-01-04 DIAGNOSIS — E118 Type 2 diabetes mellitus with unspecified complications: Secondary | ICD-10-CM

## 2018-01-27 ENCOUNTER — Other Ambulatory Visit: Payer: Self-pay | Admitting: Family Medicine

## 2018-01-31 ENCOUNTER — Encounter: Payer: Self-pay | Admitting: Family Medicine

## 2018-01-31 ENCOUNTER — Ambulatory Visit: Payer: BLUE CROSS/BLUE SHIELD | Admitting: Family Medicine

## 2018-01-31 VITALS — BP 130/84 | HR 83 | Temp 98.3°F | Wt 220.8 lb

## 2018-01-31 DIAGNOSIS — I152 Hypertension secondary to endocrine disorders: Secondary | ICD-10-CM

## 2018-01-31 DIAGNOSIS — E1159 Type 2 diabetes mellitus with other circulatory complications: Secondary | ICD-10-CM

## 2018-01-31 DIAGNOSIS — E118 Type 2 diabetes mellitus with unspecified complications: Secondary | ICD-10-CM | POA: Diagnosis not present

## 2018-01-31 DIAGNOSIS — E785 Hyperlipidemia, unspecified: Secondary | ICD-10-CM

## 2018-01-31 DIAGNOSIS — I10 Essential (primary) hypertension: Secondary | ICD-10-CM

## 2018-01-31 DIAGNOSIS — E669 Obesity, unspecified: Secondary | ICD-10-CM

## 2018-01-31 DIAGNOSIS — E1169 Type 2 diabetes mellitus with other specified complication: Secondary | ICD-10-CM

## 2018-01-31 DIAGNOSIS — Z7189 Other specified counseling: Secondary | ICD-10-CM

## 2018-01-31 NOTE — Patient Instructions (Signed)
20 minutes of something physical every day.  Cut your carbohydrates in half

## 2018-01-31 NOTE — Progress Notes (Signed)
  Subjective:    Patient ID: Paul Long, male    DOB: 03-12-1955, 62 y.o.   MRN: [id number]  Paul BlanksBarry W Long is a 62 y.o. male who presents for follow-up of Type 2 diabetes mellitus.   Patient is checking home blood sugars.   Home blood sugar records: meter record How often is blood sugars being checked: 2-3 times a week 150-180 Current symptoms/problems include none at this time. Daily foot checks: yes   Any foot concerns: no Last eye exam: 2019 Exercise: walking He continues on atorvastatin for his cholesterol.  He is also taking omega-3.  Continues on Invokana and Actos plus.  He is also using lisinopril.  His diet and exercise has been erratic.  His wife died several months ago.  He is still processing all that.  He states that he is doing reasonably well with that. The following portions of the patient's history were reviewed and updated as appropriate: allergies, current medications, past medical history, past social history and problem list.  ROS as in subjective above.     Objective:    Physical Exam Alert and in no distress otherwise not examined.   Lab Review Diabetic Labs Latest Ref Rng & Units 03/28/2017 09/14/2016 05/11/2016 12/05/2015 09/09/2015  HbA1c - 6.8 7.0 7.7 7.5 7.2  Microalbumin mg/L 5.7 - <5.0 - -  Micro/Creat Ratio - 6.8 - <8.7 - -  Chol 100 - 199 mg/dL 010135 - - 272125 -  HDL >53>39 mg/dL 42 - - 44 -  Calc LDL 0 - 99 mg/dL 60 - - 54 -  Triglycerides 0 - 149 mg/dL 664(Q165(H) - - 034136 -  Creatinine 0.76 - 1.27 mg/dL 7.420.88 - - 5.950.95 -   BP/Weight 07/26/2017 03/28/2017 09/14/2016 05/11/2016 03/31/2016  Systolic BP 120 128 130 114 112  Diastolic BP 78 84 80 60 68  Wt. (Lbs) 216.8 209 217 217 214.2  BMI 30.24 29.15 30.27 30.27 29.87   Foot/eye exam completion dates Latest Ref Rng & Units 06/28/2017 03/28/2017  Eye Exam No Retinopathy No Retinopathy -  Foot Form Completion - - Done  A1C is 8.0  Gery PrayBarry  reports that he has never smoked. He has never used smokeless tobacco.  He reports that he does not drink alcohol or use drugs.     Assessment & Plan:     Type 2 diabetes mellitus with complication, without long-term current use of insulin (HCC)  Hypertension associated with diabetes (HCC)  Obesity (BMI 30-39.9)  Hyperlipidemia associated with type 2 diabetes mellitus (HCC)  Bereavement counseling   1. Rx changes: none 2. Education: Reviewed 'ABCs' of diabetes management (respective goals in parentheses):  A1C (<7), blood pressure (<130/80), and cholesterol (LDL <100). 3. Compliance at present is estimated to be fair. Efforts to improve compliance (if necessary) will be directed at increased exercise. 4. Follow up: 4 months Encouraged him to work a little harder on his diet and exercise.  Explained that we might need to add another medication to his regimen if no improvement.  Also strongly encouraged him to get involved in bereavement counseling through [facility]osp[facility].  I discussed the emotional issues involved with the death of a loved one with him.  I encouraged him to acknowledge them and deal with the various thoughts and emotions he is going through.

## 2018-02-01 ENCOUNTER — Encounter: Payer: Self-pay | Admitting: Family Medicine

## 2018-02-01 LAB — POCT GLYCOSYLATED HEMOGLOBIN (HGB A1C): Hemoglobin A1C: 8 % — AB (ref 4.0–5.6)

## 2018-02-01 NOTE — Addendum Note (Signed)
Addended by: Renelda LomaHENRY, Winfred Iiams on: 02/01/2018 10:52 AM   Modules accepted: Orders

## 2018-03-03 ENCOUNTER — Other Ambulatory Visit: Payer: Self-pay | Admitting: Family Medicine

## 2018-03-03 DIAGNOSIS — E118 Type 2 diabetes mellitus with unspecified complications: Secondary | ICD-10-CM

## 2018-05-03 ENCOUNTER — Other Ambulatory Visit: Payer: Self-pay | Admitting: Family Medicine

## 2018-05-10 ENCOUNTER — Other Ambulatory Visit: Payer: Self-pay | Admitting: Family Medicine

## 2018-05-10 DIAGNOSIS — I152 Hypertension secondary to endocrine disorders: Secondary | ICD-10-CM

## 2018-05-10 DIAGNOSIS — I10 Essential (primary) hypertension: Principal | ICD-10-CM

## 2018-05-10 DIAGNOSIS — E1159 Type 2 diabetes mellitus with other circulatory complications: Secondary | ICD-10-CM

## 2018-06-05 ENCOUNTER — Other Ambulatory Visit: Payer: Self-pay | Admitting: Family Medicine

## 2018-06-05 DIAGNOSIS — E1169 Type 2 diabetes mellitus with other specified complication: Secondary | ICD-10-CM

## 2018-06-05 DIAGNOSIS — E785 Hyperlipidemia, unspecified: Principal | ICD-10-CM

## 2018-06-06 ENCOUNTER — Ambulatory Visit: Payer: BLUE CROSS/BLUE SHIELD | Admitting: Family Medicine

## 2018-07-01 ENCOUNTER — Other Ambulatory Visit: Payer: Self-pay | Admitting: Family Medicine

## 2018-07-01 DIAGNOSIS — E118 Type 2 diabetes mellitus with unspecified complications: Secondary | ICD-10-CM

## 2018-07-05 LAB — HM DIABETES EYE EXAM

## 2018-07-07 DIAGNOSIS — E119 Type 2 diabetes mellitus without complications: Secondary | ICD-10-CM | POA: Diagnosis not present

## 2018-07-07 DIAGNOSIS — H26493 Other secondary cataract, bilateral: Secondary | ICD-10-CM | POA: Diagnosis not present

## 2018-07-07 DIAGNOSIS — H52203 Unspecified astigmatism, bilateral: Secondary | ICD-10-CM | POA: Diagnosis not present

## 2018-07-31 ENCOUNTER — Ambulatory Visit: Payer: BC Managed Care – PPO | Admitting: Family Medicine

## 2018-07-31 ENCOUNTER — Encounter: Payer: Self-pay | Admitting: Family Medicine

## 2018-07-31 ENCOUNTER — Other Ambulatory Visit: Payer: Self-pay

## 2018-07-31 VITALS — BP 118/82 | HR 98 | Temp 99.6°F | Wt 215.8 lb

## 2018-07-31 DIAGNOSIS — M542 Cervicalgia: Secondary | ICD-10-CM

## 2018-07-31 MED ORDER — CARISOPRODOL 350 MG PO TABS: 350.0000 mg | ORAL_TABLET | Freq: Every day | ORAL | 0 refills | Status: DC

## 2018-07-31 NOTE — Patient Instructions (Signed)
Heat for 20 minutes 3 times per day.  Gentle stretching after that.  You can continue on Tylenol 2 tablets 4 times per day and also take 2 Aleve twice per day.

## 2018-07-31 NOTE — Progress Notes (Signed)
   Subjective:    Patient ID: Paul Long, male    DOB: 1955/03/06, 63 y.o.   MRN: [id number]  HPI He has a 26-month history of difficulty with posterior neck pain.  No history of injury or overuse, numbness, tingling, weakness.  The pain is made worse with any motion.  He woke up Sunday morning with increased pain and proceeded to use Tylenol and cold compresses without much success.   Review of Systems     Objective:   Physical Exam Alert and complaining of neck pain.  Pain on motion of the neck in any direction.  Normal motor, sensory and DTRs.       Assessment & Plan:  Neck pain - Plan: carisoprodol (SOMA) 350 MG tablet, . Recommend heat for 20 minutes 3 times per day with gentle stretching after that continue on Tylenol and take 2 Aleve twice per day.  May use Soma at bedtime.  He is to call if no better in a couple of weeks.

## 2018-08-01 ENCOUNTER — Other Ambulatory Visit: Payer: Self-pay | Admitting: Family Medicine

## 2018-08-02 ENCOUNTER — Telehealth: Payer: Self-pay | Admitting: Family Medicine

## 2018-08-02 NOTE — Telephone Encounter (Signed)
P.A. CARISOPRODOL completed and denied, pt needs trial both cyclobenzaprine and tizanidine.  Called pharmacy & pt paid cash $6

## 2018-08-06 ENCOUNTER — Other Ambulatory Visit: Payer: Self-pay | Admitting: Family Medicine

## 2018-08-06 DIAGNOSIS — E1159 Type 2 diabetes mellitus with other circulatory complications: Secondary | ICD-10-CM

## 2018-08-06 DIAGNOSIS — I152 Hypertension secondary to endocrine disorders: Secondary | ICD-10-CM

## 2018-09-04 ENCOUNTER — Other Ambulatory Visit: Payer: Self-pay | Admitting: Family Medicine

## 2018-09-04 DIAGNOSIS — E1169 Type 2 diabetes mellitus with other specified complication: Secondary | ICD-10-CM

## 2018-09-04 DIAGNOSIS — E785 Hyperlipidemia, unspecified: Secondary | ICD-10-CM

## 2018-09-22 ENCOUNTER — Encounter: Payer: Self-pay | Admitting: Family Medicine

## 2018-09-22 ENCOUNTER — Other Ambulatory Visit: Payer: Self-pay

## 2018-09-22 ENCOUNTER — Ambulatory Visit: Payer: BC Managed Care – PPO | Admitting: Family Medicine

## 2018-09-22 VITALS — BP 124/78 | HR 83 | Temp 98.5°F | Ht 70.0 in | Wt 216.6 lb

## 2018-09-22 DIAGNOSIS — G479 Sleep disorder, unspecified: Secondary | ICD-10-CM

## 2018-09-22 DIAGNOSIS — Z0000 Encounter for general adult medical examination without abnormal findings: Secondary | ICD-10-CM

## 2018-09-22 DIAGNOSIS — I10 Essential (primary) hypertension: Secondary | ICD-10-CM

## 2018-09-22 DIAGNOSIS — M1000 Idiopathic gout, unspecified site: Secondary | ICD-10-CM

## 2018-09-22 DIAGNOSIS — E669 Obesity, unspecified: Secondary | ICD-10-CM | POA: Diagnosis not present

## 2018-09-22 DIAGNOSIS — E118 Type 2 diabetes mellitus with unspecified complications: Secondary | ICD-10-CM

## 2018-09-22 DIAGNOSIS — E785 Hyperlipidemia, unspecified: Secondary | ICD-10-CM

## 2018-09-22 DIAGNOSIS — E1159 Type 2 diabetes mellitus with other circulatory complications: Secondary | ICD-10-CM | POA: Diagnosis not present

## 2018-09-22 DIAGNOSIS — E1169 Type 2 diabetes mellitus with other specified complication: Secondary | ICD-10-CM

## 2018-09-22 DIAGNOSIS — I152 Hypertension secondary to endocrine disorders: Secondary | ICD-10-CM

## 2018-09-22 LAB — POCT GLYCOSYLATED HEMOGLOBIN (HGB A1C): Hemoglobin A1C: 7.9 % — AB (ref 4.0–5.6)

## 2018-09-22 LAB — POCT UA - MICROALBUMIN
Albumin/Creatinine Ratio, Urine, POC: <6.8
Creatinine, POC: 73.2 mg/dL
Microalbumin Ur, POC: <5 mg/L

## 2018-09-22 MED ORDER — ALLOPURINOL 300 MG PO TABS: 300.0000 mg | ORAL_TABLET | Freq: Every day | ORAL | 3 refills | Status: DC

## 2018-09-22 MED ORDER — LISINOPRIL 10 MG PO TABS: 10.0000 mg | ORAL_TABLET | Freq: Every day | ORAL | 3 refills | Status: DC

## 2018-09-22 MED ORDER — PIOGLITAZONE HCL-METFORMIN HCL 15-850 MG PO TABS: 1.0000 | ORAL_TABLET | Freq: Two times a day (BID) | ORAL | 1 refills | Status: DC

## 2018-09-22 MED ORDER — CANAGLIFLOZIN 300 MG PO TABS: ORAL_TABLET | ORAL | 1 refills | Status: DC

## 2018-09-22 MED ORDER — ATORVASTATIN CALCIUM 20 MG PO TABS: ORAL_TABLET | ORAL | 0 refills | Status: DC

## 2018-09-22 MED ORDER — TRULICITY 0.75 MG/0.5ML ~~LOC~~ SOAJ: 1.0000 | SUBCUTANEOUS | 1 refills | Status: DC

## 2018-09-22 NOTE — Progress Notes (Signed)
Subjective:    Patient ID: Paul Long, male    DOB: January 25, 1956, 63 y.o.   MRN: [id number]  HPI He is here for complete examination.  He has been having difficulty lately with sleep disturbance.  He does take a nap usually after dinner and has noted difficulty with sleep after that.  This does not occur on a regular basis but does have some concern.  He is also getting ready to retire within the next several years. He does check his blood sugars regularly and they tend to run in the mid to high 100s.  He gets regular eye exams and does check his feet.  Does not smoke or drink.  Exercises 30 minutes daily by walking his dog.  Continues on medications listed in the chart.  He is also taking allopurinol and having no gout related symptoms.  Family and social history as well as health maintenance and immunizations was reviewed  Review of Systems  All other systems reviewed and are negative.      Objective:   Physical Exam  BP 124/78 (BP Location: Left Arm, Patient Position: Sitting)   Pulse 83   Temp 98.5 F (36.9 C)   Ht 5\' 10"  (1.778 m)   Wt 216 lb 9.6 oz (98.2 kg)   SpO2 96%   BMI 31.08 kg/m   General Appearance:    Alert, cooperative, no distress, appears stated age  Head:    Normocephalic, without obvious abnormality, atraumatic  Eyes:    PERRL, conjunctiva/corneas clear, EOM's intact, fundi    benign, both eyes       Ears:    Normal TM's and external ear canals, both ears  Nose:   Nares normal, septum midline, mucosa normal, no drainage   or sinus tenderness  Throat:   Lips, mucosa, and tongue normal; teeth and gums normal  Neck:   Supple, symmetrical, trachea midline, no adenopathy;       thyroid:  No enlargement/tenderness/nodules; no carotid   bruit or JVD  Back:     Symmetric, no curvature, ROM normal, no CVA tenderness  Lungs:     Clear to auscultation bilaterally, respirations unlabored  Chest wall:    No tenderness or deformity  Heart:    Regular rate and  rhythm, S1 and S2 normal, no murmur, rub   or gallop  Abdomen:     Soft, non-tender, bowel sounds active all four quadrants,    no masses, no organomegaly  Genitalia:  deferred  Rectal:  deferred  Extremities:   Extremities normal, atraumatic, no cyanosis or edema  Pulses:   2+ and symmetric all extremities  Skin:   Skin color, texture, turgor normal, no rashes or lesions  Lymph nodes:   Cervical, supraclavicular, and axillary nodes normal  Neurologic:   CNII-XII intact. Normal strength, sensation and reflexes      throughout        Hemoglobin A1c is 7.9 Assessment & Plan:  Routine general medical examination at a health care facility - Plan: CBC with Differential/Platelet, Comprehensive metabolic panel, Lipid panel,   Diabetes mellitus type 2 with complications ([place]) - Plan: CBC with Differential/Platelet, Comprehensive metabolic panel, Lipid panel, POCT UA - Microalbumin,   Hypertension associated with diabetes ([place]) - Plan: CBC with Differential/Platelet, Comprehensive metabolic panel, lisinopril (ZESTRIL) 10 MG tablet,   Hyperlipidemia associated with type 2 diabetes mellitus ([place]) - Plan: Lipid panel, atorvastatin (LIPITOR) 20 MG tablet,   Obesity (BMI 30-39.9) - Plan: I again discussed diet  and exercise with him.  Idiopathic gout, unspecified chronicity, unspecified site - Plan: Uric Acid,  Sleep disturbance - Plan: Information given to him concerning better sleeping habits.  Also discussed the use of melatonin.  Type 2 diabetes mellitus with complication, without long-term current use of insulin (HCC) - Plan: canagliflozin (INVOKANA) 300 MG TABS tablet,  Strongly encouraged him to continue with his present lifestyle but try to make changes in his diet in terms of cutting back on carbohydrates.  I will also start him on Trulicity the low dose.  Instructions given on proper use.

## 2018-09-22 NOTE — Patient Instructions (Signed)

## 2018-09-23 LAB — CBC WITH DIFFERENTIAL/PLATELET
Basophils Absolute: 0.1 10*3/uL (ref 0.0–0.2)
Basos: 1 %
EOS (ABSOLUTE): 0.2 10*3/uL (ref 0.0–0.4)
Eos: 3 %
Hematocrit: 48 % (ref 37.5–51.0)
Hemoglobin: 15.9 g/dL (ref 13.0–17.7)
Immature Grans (Abs): 0 10*3/uL (ref 0.0–0.1)
Immature Granulocytes: 0 %
Lymphocytes Absolute: 2.4 10*3/uL (ref 0.7–3.1)
Lymphs: 32 %
MCH: 31.7 pg (ref 26.6–33.0)
MCHC: 33.1 g/dL (ref 31.5–35.7)
MCV: 96 fL (ref 79–97)
Monocytes Absolute: 0.5 10*3/uL (ref 0.1–0.9)
Monocytes: 6 %
Neutrophils Absolute: 4.3 10*3/uL (ref 1.4–7.0)
Neutrophils: 58 %
Platelets: 297 10*3/uL (ref 150–450)
RBC: 5.01 x10E6/uL (ref 4.14–5.80)
RDW: 12.7 % (ref 11.6–15.4)
WBC: 7.5 10*3/uL (ref 3.4–10.8)

## 2018-09-23 LAB — LIPID PANEL
Chol/HDL Ratio: 3.4 ratio (ref 0.0–5.0)
Cholesterol, Total: 128 mg/dL (ref 100–199)
HDL: 38 mg/dL — ABNORMAL LOW (ref >39)
LDL Calculated: 54 mg/dL (ref 0–99)
Triglycerides: 181 mg/dL — ABNORMAL HIGH (ref 0–149)
VLDL Cholesterol Cal: 36 mg/dL (ref 5–40)

## 2018-09-23 LAB — COMPREHENSIVE METABOLIC PANEL
ALT: 41 IU/L (ref 0–44)
AST: 32 IU/L (ref 0–40)
Albumin/Globulin Ratio: 2.1 (ref 1.2–2.2)
Albumin: 4.8 g/dL (ref 3.8–4.8)
Alkaline Phosphatase: 101 IU/L (ref 39–117)
BUN/Creatinine Ratio: 15 (ref 10–24)
BUN: 14 mg/dL (ref 8–27)
Bilirubin Total: 1 mg/dL (ref 0.0–1.2)
CO2: 22 mmol/L (ref 20–29)
Calcium: 9.9 mg/dL (ref 8.6–10.2)
Chloride: 98 mmol/L (ref 96–106)
Creatinine, Ser: 0.93 mg/dL (ref 0.76–1.27)
GFR calc Af Amer: 101 mL/min/{1.73_m2} (ref >59)
GFR calc non Af Amer: 87 mL/min/{1.73_m2} (ref >59)
Globulin, Total: 2.3 g/dL (ref 1.5–4.5)
Glucose: 138 mg/dL — ABNORMAL HIGH (ref 65–99)
Potassium: 5.2 mmol/L (ref 3.5–5.2)
Sodium: 136 mmol/L (ref 134–144)
Total Protein: 7.1 g/dL (ref 6.0–8.5)

## 2018-09-23 LAB — URIC ACID: Uric Acid: 3.8 mg/dL (ref 3.7–8.6)

## 2018-10-24 ENCOUNTER — Ambulatory Visit: Payer: BC Managed Care – PPO | Admitting: Family Medicine

## 2018-10-24 ENCOUNTER — Encounter: Payer: Self-pay | Admitting: Family Medicine

## 2018-10-24 ENCOUNTER — Other Ambulatory Visit: Payer: Self-pay

## 2018-10-24 VITALS — BP 110/78 | HR 92 | Temp 97.1°F | Ht 70.0 in | Wt 217.2 lb

## 2018-10-24 DIAGNOSIS — Z23 Encounter for immunization: Secondary | ICD-10-CM

## 2018-10-24 DIAGNOSIS — E118 Type 2 diabetes mellitus with unspecified complications: Secondary | ICD-10-CM

## 2018-10-24 MED ORDER — TRULICITY 1.5 MG/0.5ML ~~LOC~~ SOAJ: 1.5000 mg | SUBCUTANEOUS | 1 refills | Status: DC

## 2018-10-24 NOTE — Progress Notes (Signed)
   Subjective:    Patient ID: Paul Long, male    DOB: [birth year], 63 y.o.   MRN: [id number]  HPI He is here for a recheck.  He states that he has not noted any change in his blood sugar readings since starting on the Trulicity.  He did bring in some numbers and they look unchanged from previous ones.  He also will need a flu shot today.   Review of Systems     Objective:   Physical Exam Alert and in no distress otherwise not examined       Assessment & Plan:  Diabetes mellitus type 2 with complications ([place]) - Plan: Dulaglutide (TRULICITY) 1.5 NL/9.7QB SOPN  Need for influenza vaccination - Plan: Flu Vaccine QUAD 36+ mos IM I will increase the Trulicity.  He was told by someone that he should not need any more than 2 diabetes medicines.  I explained to him that that was totally incorrect information.  Also discussed the use of insulin if he does not respond to this regimen.  He was also given a flu shot.

## 2018-11-02 ENCOUNTER — Other Ambulatory Visit: Payer: Self-pay | Admitting: Family Medicine

## 2018-11-02 DIAGNOSIS — E1159 Type 2 diabetes mellitus with other circulatory complications: Secondary | ICD-10-CM

## 2018-11-02 DIAGNOSIS — I152 Hypertension secondary to endocrine disorders: Secondary | ICD-10-CM

## 2018-12-20 DIAGNOSIS — Z20828 Contact with and (suspected) exposure to other viral communicable diseases: Secondary | ICD-10-CM | POA: Diagnosis not present

## 2019-01-23 ENCOUNTER — Ambulatory Visit: Payer: BC Managed Care – PPO | Admitting: Family Medicine

## 2019-02-01 ENCOUNTER — Other Ambulatory Visit: Payer: Self-pay | Admitting: Family Medicine

## 2019-02-01 DIAGNOSIS — E1159 Type 2 diabetes mellitus with other circulatory complications: Secondary | ICD-10-CM

## 2019-02-01 DIAGNOSIS — I152 Hypertension secondary to endocrine disorders: Secondary | ICD-10-CM

## 2019-02-12 ENCOUNTER — Ambulatory Visit: Payer: BC Managed Care – PPO | Admitting: Family Medicine

## 2019-02-28 ENCOUNTER — Ambulatory Visit: Payer: BC Managed Care – PPO | Admitting: Family Medicine

## 2019-02-28 ENCOUNTER — Encounter: Payer: Self-pay | Admitting: Family Medicine

## 2019-02-28 VITALS — BP 120/72 | HR 88 | Temp 96.0°F | Wt 219.6 lb

## 2019-02-28 DIAGNOSIS — E669 Obesity, unspecified: Secondary | ICD-10-CM

## 2019-02-28 DIAGNOSIS — K219 Gastro-esophageal reflux disease without esophagitis: Secondary | ICD-10-CM | POA: Insufficient documentation

## 2019-02-28 DIAGNOSIS — I10 Essential (primary) hypertension: Secondary | ICD-10-CM

## 2019-02-28 DIAGNOSIS — I152 Hypertension secondary to endocrine disorders: Secondary | ICD-10-CM

## 2019-02-28 DIAGNOSIS — E1169 Type 2 diabetes mellitus with other specified complication: Secondary | ICD-10-CM

## 2019-02-28 DIAGNOSIS — E118 Type 2 diabetes mellitus with unspecified complications: Secondary | ICD-10-CM

## 2019-02-28 DIAGNOSIS — E1159 Type 2 diabetes mellitus with other circulatory complications: Secondary | ICD-10-CM

## 2019-02-28 DIAGNOSIS — Z23 Encounter for immunization: Secondary | ICD-10-CM

## 2019-02-28 DIAGNOSIS — M1000 Idiopathic gout, unspecified site: Secondary | ICD-10-CM

## 2019-02-28 DIAGNOSIS — E785 Hyperlipidemia, unspecified: Secondary | ICD-10-CM

## 2019-02-28 LAB — POCT GLYCOSYLATED HEMOGLOBIN (HGB A1C): Hemoglobin A1C: 7.4 % — AB (ref 4.0–5.6)

## 2019-02-28 NOTE — Patient Instructions (Signed)
Try Axid, Pepcid, Zantac daily to see if it controls the symptoms and then see if he can back off to every other day.  You can actually double the dose of those medications if you need to

## 2019-02-28 NOTE — Progress Notes (Signed)
Subjective:    Patient ID: Paul Long, male    DOB: 1955/12/30, 64 y.o.   MRN: [id number]  Paul Long is a 64 y.o. male who presents for follow-up of Type 2 diabetes mellitus.  Home blood sugar records: meter records 130-200 fasting and post meal  Current symptoms/problems include side effect to new medicine . Daily foot checks: yes   Any foot concerns: none Exercise: walking 30 min QD Diet:healthly he does get several meals from his mother who lives next door otherwise he eats sandwiches a lot. He states that since starting Trulicity, he has had reflux type symptoms and has been taking Tums.  He takes 2 or 3/day.  He continues on allopurinol and has had no difficulty with that medication.  Also atorvastatin is causing no aches or pains.  He continues on Invokana as well as Actos plus and having no difficulties with them. The following portions of the patient's history were reviewed and updated as appropriate: allergies, current medications, past medical history, past social history and problem list.  ROS as in subjective above.     Objective:    Physical Exam Alert and in no distress otherwise not examined. Hemoglobin A1c is 7.4 lab Review Diabetic Labs Latest Ref Rng & Units 09/22/2018 02/01/2018 03/28/2017 09/14/2016 05/11/2016  HbA1c 4.0 - 5.6 % 7.9(A) 8.0(A) 6.8 7.0 7.7  Microalbumin mg/L <5.0 - 5.7 - <5.0  Micro/Creat Ratio - <6.8 - 6.8 - <8.7  Chol 100 - 199 mg/dL 169 - 678 - -  HDL >93 mg/dL 81(O) - 42 - -  Calc LDL 0 - 99 mg/dL 54 - 60 - -  Triglycerides 0 - 149 mg/dL 175(Z) - 025(E) - -  Creatinine 0.76 - 1.27 mg/dL 5.27 - 7.82 - -   BP/Weight 10/24/2018 09/22/2018 07/31/2018 01/31/2018 07/26/2017  Systolic BP 110 124 118 130 120  Diastolic BP 78 78 82 84 78  Wt. (Lbs) 217.2 216.6 215.8 220.8 216.8  BMI 31.16 31.08 30.1 30.8 30.24   Foot/eye exam completion dates Latest Ref Rng & Units 07/05/2018 06/28/2017  Eye Exam No Retinopathy No Retinopathy No Retinopathy  Foot  Form Completion - - -    Paul Long  reports that he has never smoked. He has never used smokeless tobacco. He reports that he does not drink alcohol or use drugs.     Assessment & Plan:    Diabetes mellitus type 2 with complications (HCC) - Plan: HgB A1c  Obesity (BMI 30-39.9)  Hypertension associated with diabetes (HCC)  Hyperlipidemia associated with type 2 diabetes mellitus (HCC)  Gastroesophageal reflux disease, unspecified whether esophagitis present  Need for Tdap vaccination - Plan: Tdap vaccine greater than or equal to 7yo IM  Idiopathic gout, unspecified chronicity, unspecified site   1. Rx changes: none 2. Education: Reviewed 'ABCs' of diabetes management (respective goals in parentheses):  A1C (<7), blood pressure (<130/80), and cholesterol (LDL <100). 3. Compliance at present is estimated to be good. Efforts to improve compliance (if necessary) will be directed at No change. 4. Follow up: 4 months I will have him use H2 blockers to see if that will help with his symptoms and then back off and go every other day if possible.  Briefly discussed the possibility of going to a PPI but will hold off on that.  I explained this to him in terms of risk versus benefit since the Trulicity plus his other meds seems to be keeping his diabetes in good control.

## 2019-03-09 ENCOUNTER — Other Ambulatory Visit: Payer: Self-pay | Admitting: Family Medicine

## 2019-03-09 DIAGNOSIS — E1169 Type 2 diabetes mellitus with other specified complication: Secondary | ICD-10-CM

## 2019-04-17 ENCOUNTER — Other Ambulatory Visit: Payer: Self-pay | Admitting: Family Medicine

## 2019-04-17 DIAGNOSIS — E118 Type 2 diabetes mellitus with unspecified complications: Secondary | ICD-10-CM

## 2019-04-30 ENCOUNTER — Other Ambulatory Visit: Payer: Self-pay | Admitting: Family Medicine

## 2019-05-02 ENCOUNTER — Other Ambulatory Visit: Payer: Self-pay | Admitting: Family Medicine

## 2019-05-02 DIAGNOSIS — E1159 Type 2 diabetes mellitus with other circulatory complications: Secondary | ICD-10-CM

## 2019-05-02 DIAGNOSIS — I152 Hypertension secondary to endocrine disorders: Secondary | ICD-10-CM

## 2019-05-08 ENCOUNTER — Other Ambulatory Visit: Payer: Self-pay | Admitting: Family Medicine

## 2019-05-08 DIAGNOSIS — E118 Type 2 diabetes mellitus with unspecified complications: Secondary | ICD-10-CM

## 2019-05-30 ENCOUNTER — Ambulatory Visit: Payer: BLUE CROSS/BLUE SHIELD | Admitting: Family Medicine

## 2019-06-05 ENCOUNTER — Other Ambulatory Visit: Payer: Self-pay | Admitting: Family Medicine

## 2019-06-05 DIAGNOSIS — E785 Hyperlipidemia, unspecified: Secondary | ICD-10-CM

## 2019-06-05 DIAGNOSIS — E1169 Type 2 diabetes mellitus with other specified complication: Secondary | ICD-10-CM

## 2019-06-07 ENCOUNTER — Telehealth: Payer: Self-pay

## 2019-06-07 NOTE — Telephone Encounter (Signed)
LVM advising of the need to move pt appt from 06-28-19 to 06-27-19 due to Dr. Susann Givens being out of the office. KH

## 2019-06-28 ENCOUNTER — Ambulatory Visit: Payer: BC Managed Care – PPO | Admitting: Family Medicine

## 2019-07-07 ENCOUNTER — Other Ambulatory Visit: Payer: Self-pay | Admitting: Family Medicine

## 2019-07-07 DIAGNOSIS — E118 Type 2 diabetes mellitus with unspecified complications: Secondary | ICD-10-CM

## 2019-07-07 DIAGNOSIS — E785 Hyperlipidemia, unspecified: Secondary | ICD-10-CM

## 2019-07-07 DIAGNOSIS — E1169 Type 2 diabetes mellitus with other specified complication: Secondary | ICD-10-CM

## 2019-07-13 DIAGNOSIS — H26493 Other secondary cataract, bilateral: Secondary | ICD-10-CM | POA: Diagnosis not present

## 2019-07-13 DIAGNOSIS — E119 Type 2 diabetes mellitus without complications: Secondary | ICD-10-CM | POA: Diagnosis not present

## 2019-07-13 LAB — HM DIABETES EYE EXAM

## 2019-07-19 ENCOUNTER — Encounter: Payer: Self-pay | Admitting: Family Medicine

## 2019-07-19 ENCOUNTER — Other Ambulatory Visit: Payer: Self-pay

## 2019-07-19 ENCOUNTER — Ambulatory Visit: Payer: BC Managed Care – PPO | Admitting: Family Medicine

## 2019-07-19 VITALS — BP 86/62 | HR 88 | Temp 97.8°F | Wt 216.8 lb

## 2019-07-19 DIAGNOSIS — E1169 Type 2 diabetes mellitus with other specified complication: Secondary | ICD-10-CM | POA: Diagnosis not present

## 2019-07-19 DIAGNOSIS — E118 Type 2 diabetes mellitus with unspecified complications: Secondary | ICD-10-CM

## 2019-07-19 DIAGNOSIS — E1159 Type 2 diabetes mellitus with other circulatory complications: Secondary | ICD-10-CM

## 2019-07-19 DIAGNOSIS — K219 Gastro-esophageal reflux disease without esophagitis: Secondary | ICD-10-CM

## 2019-07-19 DIAGNOSIS — E785 Hyperlipidemia, unspecified: Secondary | ICD-10-CM

## 2019-07-19 DIAGNOSIS — E669 Obesity, unspecified: Secondary | ICD-10-CM | POA: Diagnosis not present

## 2019-07-19 DIAGNOSIS — I10 Essential (primary) hypertension: Secondary | ICD-10-CM

## 2019-07-19 DIAGNOSIS — M1000 Idiopathic gout, unspecified site: Secondary | ICD-10-CM

## 2019-07-19 DIAGNOSIS — I152 Hypertension secondary to endocrine disorders: Secondary | ICD-10-CM

## 2019-07-19 LAB — POCT GLYCOSYLATED HEMOGLOBIN (HGB A1C): Hemoglobin A1C: 7.1 % — AB (ref 4.0–5.6)

## 2019-07-19 MED ORDER — DAPAGLIFLOZIN PROPANEDIOL 5 MG PO TABS: 5.0000 mg | ORAL_TABLET | Freq: Every day | ORAL | 1 refills | Status: DC

## 2019-07-19 NOTE — Progress Notes (Signed)
  Subjective:    Patient ID: Paul Long, male    DOB: [birth year], 64 y.o.   MRN: [id number]  Paul Long is a 64 y.o. male who presents for follow-up of Type 2 diabetes mellitus.  Home blood sugar records: meter record, fasting and post meal 150-170 Current symptoms/problems include none at this time. Daily foot checks: yes   Any foot concerns: none Exercise: He walks 30 minutes daily Diet: fair  he continues on Lipitor and Trulicity and  the Actos plus his insurance wants him to switch to a different SGLT2.  He is not having any difficulty with his gout and his reflux symptoms seem to be under good control.  He continues on allopurinol without difficulty. He has had no difficulty with weakness, dizziness, heart rate changes. The following portions of the patient's history were reviewed and updated as appropriate: allergies, current medications, past medical history, past social history and problem list.  ROS as in subjective above.     Objective:    Physical Exam Alert and in no distress otherwise not examined.  Blood pressure is slightly low. Hemoglobin A1c is 7.1. Lab Review Diabetic Labs Latest Ref Rng & Units 02/28/2019 09/22/2018 02/01/2018 03/28/2017 09/14/2016  HbA1c 4.0 - 5.6 % 7.4(A) 7.9(A) 8.0(A) 6.8 7.0  Microalbumin mg/L - <5.0 - 5.7 -  Micro/Creat Ratio - - <6.8 - 6.8 -  Chol 100 - 199 mg/dL - 563 - 875 -  HDL >64 mg/dL - 33(I) - 42 -  Calc LDL 0 - 99 mg/dL - 54 - 60 -  Triglycerides 0 - 149 mg/dL - 951(O) - 841(Y) -  Creatinine 0.76 - 1.27 mg/dL - 6.06 - 3.01 -   BP/Weight 02/28/2019 10/24/2018 09/22/2018 07/31/2018 01/31/2018  Systolic BP 120 110 124 118 130  Diastolic BP 72 78 78 82 84  Wt. (Lbs) 219.6 217.2 216.6 215.8 220.8  BMI 31.51 31.16 31.08 30.1 30.8   Foot/eye exam completion dates Latest Ref Rng & Units 07/05/2018 06/28/2017  Eye Exam No Retinopathy No Retinopathy No Retinopathy  Foot Form Completion - - -    Trea  reports that he has never  smoked. He has never used smokeless tobacco. He reports that he does not drink alcohol or use drugs.     Assessment & Plan:    Diabetes mellitus type 2 with complications (HCC) - Plan: POCT glycosylated hemoglobin (Hb A1C), dapagliflozin propanediol (FARXIGA) 5 MG TABS tablet  Obesity (BMI 30-39.9)  Hypertension associated with diabetes (HCC)  Hyperlipidemia associated with type 2 diabetes mellitus (HCC)  Idiopathic gout, unspecified chronicity, unspecified site  Gastroesophageal reflux disease, unspecified whether esophagitis present    1. Rx changes: Recommend he cut his lisinopril in half and monitor his blood pressure.  I will also switch him to [place]. 2. Education: Reviewed 'ABCs' of diabetes management (respective goals in parentheses):  A1C (<7), blood pressure (<130/80), and cholesterol (LDL <100). 3. Compliance at present is estimated to be good. Efforts to improve compliance (if necessary) will be directed at increased exercise. 4. Follow up: 4 months Overall he seems to be doing fairly well.  His wife is in bed for the approximately 2 years.  He seems to be adjusting quite nicely to his new lifestyle.

## 2019-07-23 ENCOUNTER — Telehealth: Payer: Self-pay | Admitting: Family Medicine

## 2019-07-23 NOTE — Telephone Encounter (Signed)
Requested records received from [facility].

## 2019-07-30 ENCOUNTER — Telehealth: Payer: Self-pay

## 2019-07-30 ENCOUNTER — Telehealth: Payer: Self-pay | Admitting: Family Medicine

## 2019-07-30 NOTE — Telephone Encounter (Signed)
PT CALLED AND ADVISED HIS B/P WENT LOW AGAIN TO 86/70 SINCE HE TOOK A HALF A TABLET . PT ADVISED HE STARTED COQ 10 AND WOULD LIKE TO KNOW IF THIS HAS SOME EFFECT ON THE B/ P. HE WOULD ALSO LIKE YOU TO KNOW HE STOPPED THE B/P MED AND SINCE THAN LAST B/P READING 137/85 PLEASE ADVISE IF HE SHOULD STAY OFF OR TAKE MED ON A DIFFERENT SCHEDULE . KH

## 2019-07-30 NOTE — Telephone Encounter (Signed)
Pt left message on my voice mail Friday.  I was out of office Friday.  He stated that his BP is running low, even though Dr. Susann Givens told him to cut his Lisinopril in 1/2.  Pt requested CMA to call him back.  I have attempted to call him this morning to apologize for not returning his call Friday, no answer.  Reached voice mail that is full.  Please call pt [phone]

## 2019-07-30 NOTE — Telephone Encounter (Signed)
Stop the BP med

## 2019-07-30 NOTE — Telephone Encounter (Signed)
Pt was advised to stop med and keep an eye on his b/p. KH

## 2019-07-30 NOTE — Telephone Encounter (Signed)
SPOKE TO PT AND SENT DR.LALONDE A MESSAGE CONCERNING PT B/P. KH

## 2019-07-30 NOTE — Telephone Encounter (Signed)
I think that was you last message and his b/p still dropped. Please advise. KH

## 2019-07-30 NOTE — Telephone Encounter (Signed)
Have him take one half of the lisinopril daily and see how he does.

## 2019-08-01 ENCOUNTER — Telehealth: Payer: Self-pay | Admitting: Family Medicine

## 2019-08-01 DIAGNOSIS — E1159 Type 2 diabetes mellitus with other circulatory complications: Secondary | ICD-10-CM

## 2019-08-01 DIAGNOSIS — I152 Hypertension secondary to endocrine disorders: Secondary | ICD-10-CM

## 2019-08-01 MED ORDER — LISINOPRIL 10 MG PO TABS: 10.0000 mg | ORAL_TABLET | Freq: Every day | ORAL | 0 refills | Status: DC

## 2019-08-01 NOTE — Addendum Note (Signed)
Addended by: Debbrah Alar F on: 08/01/2019 04:13 PM   Modules accepted: Orders

## 2019-08-01 NOTE — Telephone Encounter (Signed)
Done

## 2019-08-01 NOTE — Telephone Encounter (Signed)
[facility] req Lisinopril 10mg  #90

## 2019-08-04 ENCOUNTER — Other Ambulatory Visit: Payer: Self-pay | Admitting: Family Medicine

## 2019-08-26 ENCOUNTER — Other Ambulatory Visit: Payer: Self-pay | Admitting: Family Medicine

## 2019-09-05 ENCOUNTER — Other Ambulatory Visit: Payer: Self-pay | Admitting: Family Medicine

## 2019-09-05 DIAGNOSIS — E118 Type 2 diabetes mellitus with unspecified complications: Secondary | ICD-10-CM

## 2019-11-19 ENCOUNTER — Encounter: Payer: BC Managed Care – PPO | Admitting: Family Medicine

## 2019-11-30 ENCOUNTER — Other Ambulatory Visit: Payer: Self-pay | Admitting: Family Medicine

## 2019-12-04 ENCOUNTER — Other Ambulatory Visit: Payer: Self-pay | Admitting: Family Medicine

## 2019-12-04 DIAGNOSIS — E118 Type 2 diabetes mellitus with unspecified complications: Secondary | ICD-10-CM

## 2019-12-04 DIAGNOSIS — E785 Hyperlipidemia, unspecified: Secondary | ICD-10-CM

## 2019-12-04 DIAGNOSIS — E1169 Type 2 diabetes mellitus with other specified complication: Secondary | ICD-10-CM

## 2019-12-19 ENCOUNTER — Encounter: Payer: Self-pay | Admitting: Family Medicine

## 2019-12-19 ENCOUNTER — Other Ambulatory Visit: Payer: Self-pay

## 2019-12-19 ENCOUNTER — Ambulatory Visit: Payer: BC Managed Care – PPO | Admitting: Family Medicine

## 2019-12-19 VITALS — BP 120/88 | HR 93 | Wt 215.0 lb

## 2019-12-19 DIAGNOSIS — M1000 Idiopathic gout, unspecified site: Secondary | ICD-10-CM

## 2019-12-19 DIAGNOSIS — I152 Hypertension secondary to endocrine disorders: Secondary | ICD-10-CM | POA: Diagnosis not present

## 2019-12-19 DIAGNOSIS — Z23 Encounter for immunization: Secondary | ICD-10-CM

## 2019-12-19 DIAGNOSIS — E1159 Type 2 diabetes mellitus with other circulatory complications: Secondary | ICD-10-CM

## 2019-12-19 DIAGNOSIS — E785 Hyperlipidemia, unspecified: Secondary | ICD-10-CM | POA: Diagnosis not present

## 2019-12-19 DIAGNOSIS — E1169 Type 2 diabetes mellitus with other specified complication: Secondary | ICD-10-CM

## 2019-12-19 DIAGNOSIS — R0981 Nasal congestion: Secondary | ICD-10-CM

## 2019-12-19 DIAGNOSIS — E118 Type 2 diabetes mellitus with unspecified complications: Secondary | ICD-10-CM | POA: Diagnosis not present

## 2019-12-19 LAB — POCT GLYCOSYLATED HEMOGLOBIN (HGB A1C): Hemoglobin A1C: 6.6 % — AB (ref 4.0–5.6)

## 2019-12-19 MED ORDER — FLUTICASONE PROPIONATE 50 MCG/ACT NA SUSP: 2.0000 | Freq: Every day | NASAL | 6 refills | Status: DC

## 2019-12-19 NOTE — Progress Notes (Signed)
Subjective:    Patient ID: Paul Long, male    DOB: [birth year], 64 y.o.   MRN: [id number]  Paul Long is a 64 y.o. male who presents for follow-up of Type 2 diabetes mellitus.  HTN- checks BP at home and readings have been fairly consistent with today's readings in the 130/80 range.  He and Dr. Redmond School held his lisinopril since his last visit due to low blood pressure readings.  He is feeling fine.  Hyperlipidemia-reports good compliance with his statin and co-Q10.  Gout-taking allopurinol daily without any concerns.  No recent gout flares.  Complains of nasal congestion for months.  He has been using the generic for Afrin which gives him tempory relief.  Denies fever, chills, dizziness, chest pain, palpitations, shortness of breath, abdominal pain, N/V/D, urinary symptoms, LE edema.   He would like to get his Pfizer Covid booster and flu shot today.   Patient is checking home blood sugars.   Home blood sugar records: BGs range between 150 and 170 How often is blood sugars being checked: once a day Current symptoms include: none. Patient denies foot ulcerations, increased appetite, nausea, visual disturbances, vomiting and weight loss.  Patient is checking their feet daily. Any Foot concerns (callous, ulcer, wound, thickened nails, toenail fungus, skin fungus, hammer toe): none Last dilated eye exam: 6 months ago  Current treatments: doing well on DM meds. Actoplus met, Trulicity and [place]. He is also on atorvastatin and lisinopril.  Medication compliance: good  Current diet: in general, a "healthy" diet   Current exercise: walking Known diabetic complications: none  The following portions of the patient's history were reviewed and updated as appropriate: allergies, current medications, past medical history, past social history and problem list.  ROS as in subjective above.     Objective:    Physical Exam Alert and in no distress otherwise not examined.   Respirations unlabored.  Blood pressure 120/88, pulse 93, weight 215 lb (97.5 kg).  Lab Review Diabetic Labs Latest Ref Rng & Units 12/19/2019 07/19/2019 02/28/2019 09/22/2018 02/01/2018  HbA1c 4.0 - 5.6 % 6.6(A) 7.1(A) 7.4(A) 7.9(A) 8.0(A)  Microalbumin mg/L - - - <5.0 -  Micro/Creat Ratio - - - - <6.8 -  Chol 100 - 199 mg/dL - - - 128 -  HDL >39 mg/dL - - - 38(L) -  Calc LDL 0 - 99 mg/dL - - - 54 -  Triglycerides 0 - 149 mg/dL - - - 181(H) -  Creatinine 0.76 - 1.27 mg/dL - - - 0.93 -   BP/Weight 12/19/2019 07/19/2019 02/28/2019 06/21/4330 10/21/1882  Systolic BP 166 86 063 016 010  Diastolic BP 88 62 72 78 78  Wt. (Lbs) 215 216.8 219.6 217.2 216.6  BMI 30.85 31.11 31.51 31.16 31.08   Foot/eye exam completion dates Latest Ref Rng & Units 07/13/2019 07/13/2019  Eye Exam No Retinopathy No Retinopathy No Retinopathy  Foot Form Completion - - -    Paul Long  reports that he has never smoked. He has never used smokeless tobacco. He reports that he does not drink alcohol and does not use drugs.     Assessment & Plan:    Diabetes mellitus type 2 with complications ([place]) - Plan: HgB A1c, CBC with Differential/Platelet, Comprehensive metabolic panel, Microalbumin / creatinine urine ratio, TSH, T4, free  Hypertension associated with diabetes ([place]) - Plan: CBC with Differential/Platelet, Comprehensive metabolic panel  Hyperlipidemia associated with type 2 diabetes mellitus ([place]) - Plan: Lipid panel  Idiopathic gout, unspecified chronicity,  unspecified site - Plan: Uric acid  Chronic nasal congestion - Plan: fluticasone (FLONASE) 50 MCG/ACT nasal spray  Needs flu shot - Plan: Flu Vaccine QUAD 36+ mos IM  COVID-19 vaccine administered - Plan: Pfizer SARS-COV-2 Vaccine  1. Rx changes: none Hgb A1c 6.6% (better) 2. Education: Reviewed 'ABCs' of diabetes management (respective goals in parentheses):  A1C (<7), blood pressure (<130/80), and cholesterol (LDL <100). 3. Compliance at present is estimated to  be good. Efforts to improve compliance (if necessary) will be directed at dietary modifications: Continue with a healthy, well-balanced and low-carb diet, increased exercise and regular blood sugar monitoring: daily. 4. HTN-diastolic blood pressure slightly elevated today.  He will keep an eye on his blood pressure readings at home.  He has been holding his lisinopril due to low blood pressure.  Discussed that he may need to start back on this and follow-up with his PCP. 5. HL-doing well with his statin and co-Q10.  Continue therapy.  He is fasting I will check his lipids today. 6. Gout-continue allopurinol.  No recent flares.  I will check his uric acid level. 7. Nasal congestion for months-stop Afrin and try a nasal steroid such as Flonase.  He may also try Mucinex.  Consider a saline nasal spray. follow-up if not improving. 8. Flu and [place] Covid booster will be given today.  Counseling done on potential side effects of the vaccines. 9. Follow up: 4 months he also needs to schedule a CPE with his PCP, Dr. Redmond School.

## 2019-12-19 NOTE — Patient Instructions (Signed)
Your hemoglobin A1c is 6.6% and this has improved since your previous one.  Continue eating a healthy, low-carb diet.  Limit sugary drinks. Stay active and try to get at least 150 minutes of physical activity each week.  Continue on your current medications.  Keep an eye on your blood pressure at home and if it is consistently higher than 130/80, you may want to consider starting back on your lisinopril.  Stop using Afrin (or the generic) and try the Flonase.  You may also try Mucinex to help thin out the mucus.  Let us know if you are not seeing significant improvement.  You are due for a annual physical exam with Dr. Susann Givens

## 2019-12-20 LAB — CBC WITH DIFFERENTIAL/PLATELET
Basophils Absolute: 0.1 10*3/uL (ref 0.0–0.2)
Basos: 1 %
EOS (ABSOLUTE): 0.2 10*3/uL (ref 0.0–0.4)
Eos: 3 %
Hematocrit: 49.2 % (ref 37.5–51.0)
Hemoglobin: 16.4 g/dL (ref 13.0–17.7)
Immature Grans (Abs): 0 10*3/uL (ref 0.0–0.1)
Immature Granulocytes: 0 %
Lymphocytes Absolute: 2.3 10*3/uL (ref 0.7–3.1)
Lymphs: 28 %
MCH: 31.8 pg (ref 26.6–33.0)
MCHC: 33.3 g/dL (ref 31.5–35.7)
MCV: 96 fL (ref 79–97)
Monocytes Absolute: 0.5 10*3/uL (ref 0.1–0.9)
Monocytes: 6 %
Neutrophils Absolute: 5.1 10*3/uL (ref 1.4–7.0)
Neutrophils: 62 %
Platelets: 267 10*3/uL (ref 150–450)
RBC: 5.15 x10E6/uL (ref 4.14–5.80)
RDW: 13.4 % (ref 11.6–15.4)
WBC: 8.3 10*3/uL (ref 3.4–10.8)

## 2019-12-20 LAB — COMPREHENSIVE METABOLIC PANEL
ALT: 31 IU/L (ref 0–44)
AST: 22 IU/L (ref 0–40)
Albumin/Globulin Ratio: 2 (ref 1.2–2.2)
Albumin: 4.6 g/dL (ref 3.8–4.8)
Alkaline Phosphatase: 126 IU/L — ABNORMAL HIGH (ref 44–121)
BUN/Creatinine Ratio: 20 (ref 10–24)
BUN: 17 mg/dL (ref 8–27)
Bilirubin Total: 0.9 mg/dL (ref 0.0–1.2)
CO2: 26 mmol/L (ref 20–29)
Calcium: 9.7 mg/dL (ref 8.6–10.2)
Chloride: 102 mmol/L (ref 96–106)
Creatinine, Ser: 0.85 mg/dL (ref 0.76–1.27)
GFR calc Af Amer: 106 mL/min/{1.73_m2} (ref >59)
GFR calc non Af Amer: 92 mL/min/{1.73_m2} (ref >59)
Globulin, Total: 2.3 g/dL (ref 1.5–4.5)
Glucose: 124 mg/dL — ABNORMAL HIGH (ref 65–99)
Potassium: 4.7 mmol/L (ref 3.5–5.2)
Sodium: 141 mmol/L (ref 134–144)
Total Protein: 6.9 g/dL (ref 6.0–8.5)

## 2019-12-20 LAB — LIPID PANEL
Chol/HDL Ratio: 3 ratio (ref 0.0–5.0)
Cholesterol, Total: 120 mg/dL (ref 100–199)
HDL: 40 mg/dL (ref >39)
LDL Chol Calc (NIH): 56 mg/dL (ref 0–99)
Triglycerides: 137 mg/dL (ref 0–149)
VLDL Cholesterol Cal: 24 mg/dL (ref 5–40)

## 2019-12-20 LAB — T4, FREE: Free T4: 1.09 ng/dL (ref 0.82–1.77)

## 2019-12-20 LAB — MICROALBUMIN / CREATININE URINE RATIO
Creatinine, Urine: 58.4 mg/dL
Microalb/Creat Ratio: 6 mg/g creat (ref 0–29)
Microalbumin, Urine: 3.5 ug/mL

## 2019-12-20 LAB — URIC ACID: Uric Acid: 3.4 mg/dL — ABNORMAL LOW (ref 3.8–8.4)

## 2019-12-20 LAB — TSH: TSH: 3.16 u[IU]/mL (ref 0.450–4.500)

## 2019-12-20 NOTE — Progress Notes (Signed)
FYI

## 2019-12-23 ENCOUNTER — Other Ambulatory Visit: Payer: Self-pay | Admitting: Family Medicine

## 2019-12-23 DIAGNOSIS — E1169 Type 2 diabetes mellitus with other specified complication: Secondary | ICD-10-CM

## 2020-01-19 ENCOUNTER — Other Ambulatory Visit: Payer: Self-pay | Admitting: Family Medicine

## 2020-01-19 DIAGNOSIS — E785 Hyperlipidemia, unspecified: Secondary | ICD-10-CM

## 2020-01-19 DIAGNOSIS — E1169 Type 2 diabetes mellitus with other specified complication: Secondary | ICD-10-CM

## 2020-02-16 ENCOUNTER — Other Ambulatory Visit: Payer: Self-pay | Admitting: Family Medicine

## 2020-02-29 ENCOUNTER — Other Ambulatory Visit: Payer: Self-pay | Admitting: Family Medicine

## 2020-02-29 DIAGNOSIS — E1169 Type 2 diabetes mellitus with other specified complication: Secondary | ICD-10-CM

## 2020-02-29 DIAGNOSIS — E785 Hyperlipidemia, unspecified: Secondary | ICD-10-CM

## 2020-05-06 ENCOUNTER — Other Ambulatory Visit: Payer: Self-pay | Admitting: Family Medicine

## 2020-05-06 DIAGNOSIS — E118 Type 2 diabetes mellitus with unspecified complications: Secondary | ICD-10-CM

## 2020-05-10 ENCOUNTER — Other Ambulatory Visit: Payer: Self-pay | Admitting: Family Medicine

## 2020-05-10 DIAGNOSIS — E118 Type 2 diabetes mellitus with unspecified complications: Secondary | ICD-10-CM

## 2020-05-22 ENCOUNTER — Other Ambulatory Visit: Payer: Self-pay

## 2020-05-22 ENCOUNTER — Ambulatory Visit (INDEPENDENT_AMBULATORY_CARE_PROVIDER_SITE_OTHER): Payer: Medicare Other | Admitting: Family Medicine

## 2020-05-22 ENCOUNTER — Encounter: Payer: Self-pay | Admitting: Family Medicine

## 2020-05-22 VITALS — BP 110/80 | HR 92 | Temp 98.2°F | Ht 68.0 in | Wt 215.2 lb

## 2020-05-22 DIAGNOSIS — M1000 Idiopathic gout, unspecified site: Secondary | ICD-10-CM | POA: Diagnosis not present

## 2020-05-22 DIAGNOSIS — E118 Type 2 diabetes mellitus with unspecified complications: Secondary | ICD-10-CM

## 2020-05-22 DIAGNOSIS — I152 Hypertension secondary to endocrine disorders: Secondary | ICD-10-CM

## 2020-05-22 DIAGNOSIS — E1159 Type 2 diabetes mellitus with other circulatory complications: Secondary | ICD-10-CM | POA: Diagnosis not present

## 2020-05-22 DIAGNOSIS — E785 Hyperlipidemia, unspecified: Secondary | ICD-10-CM

## 2020-05-22 DIAGNOSIS — E669 Obesity, unspecified: Secondary | ICD-10-CM

## 2020-05-22 DIAGNOSIS — K219 Gastro-esophageal reflux disease without esophagitis: Secondary | ICD-10-CM

## 2020-05-22 DIAGNOSIS — E1169 Type 2 diabetes mellitus with other specified complication: Secondary | ICD-10-CM | POA: Diagnosis not present

## 2020-05-22 LAB — POCT GLYCOSYLATED HEMOGLOBIN (HGB A1C): Hemoglobin A1C: 7.4 % — AB (ref 4.0–5.6)

## 2020-05-22 NOTE — Progress Notes (Signed)
Paul Long is a 65 y.o. male who presents for welcome to Medicare visit and follow-up on chronic medical conditions.  He has recently retired and has become more physically active.  He is also made some dietary changes since he is not on the road so much.  He is helping his daughter who had a recent fracture and his mother is getting older requiring more help.  He does have underlying diabetes and is taking [place], Actos plus and Trulicity.  Having no difficulty with them.  Lipitor is causing no aches or pains.  His allergies seem to be under good control.  He continues on allopurinol and is having no gouty issues.  He apparently has had some blood pressure issues and did stop taking the lisinopril.  Asked him to start taking that again stating it is more for kidney preservation.  He has an eye exam set up and in the future.   Immunizations and Health Maintenance Immunization History  Administered Date(s) Administered  . Influenza Split 11/30/2010, 12/03/2011  . Influenza Whole 12/02/2006, 11/03/2007, 11/11/2009  . Influenza,inj,Quad PF,6+ Mos 12/08/2012, 10/31/2013, 11/19/2014, 12/05/2015, 11/04/2016, 11/24/2017, 10/24/2018, 12/19/2019  . PFIZER(Purple Top)SARS-COV-2 Vaccination 04/30/2019, 05/21/2019, 12/19/2019  . Pneumococcal Conjugate-13 10/31/2013  . Pneumococcal Polysaccharide-23 05/02/2015  . Tdap 09/24/2009, 02/28/2019  . Zoster 05/02/2015  . Zoster Recombinat (Shingrix) 05/11/2016, 11/04/2016   Health Maintenance Due  Topic Date Due  . PNA vac Low Risk Adult (2 of 2 - PPSV23) 05/01/2020    Last colonoscopy: 12/12/15 Last PSA: 07/26/17 Dentist: Q six months Ophtho: Q year Exercise: walking Q day   Other doctors caring for patient include: Dr. Russella [facility]  Advanced Directives: Does Patient Have a Medical Advance Directive?: Yes Type of Advance Directive: Living will Copy asked for Depression screen:  See questionnaire below.     Depression screen [facility] 2/9 05/22/2020  09/22/2018 05/11/2016 01/13/2015 10/31/2013  Decreased Interest 0 0 0 0 0  Down, Depressed, Hopeless 0 0 0 0 0  PHQ - 2 Score 0 0 0 0 0    Fall Screen: See Questionaire below.   Fall Risk  05/22/2020 05/11/2016 01/13/2015  Falls in the past year? 0 No Yes  Number falls in past yr: 0 - 1  Injury with Fall? 0 - Yes  Risk for fall due to : No Fall Risks - Other (Comment)  Follow up Falls evaluation completed - Falls evaluation completed    ADL screen:  See questionnaire below.  Functional Status Survey: Is the patient deaf or have difficulty hearing?: No Does the patient have difficulty seeing, even when wearing glasses/contacts?: No Does the patient have difficulty concentrating, remembering, or making decisions?: No Does the patient have difficulty walking or climbing stairs?: No Does the patient have difficulty dressing or bathing?: No Does the patient have difficulty doing errands alone such as visiting a doctor's office or shopping?: No   Review of Systems  Constitutional: -, -unexpected weight change, -anorexia, -fatigue Allergy: -sneezing, -itching, -congestion Dermatology: denies changing moles, rash, lumps ENT: -runny nose, -ear pain, -sore throat,  Cardiology:  -chest pain, -palpitations, -orthopnea, Respiratory: -cough, -shortness of breath, -dyspnea on exertion, -wheezing,  Gastroenterology: -abdominal pain, -nausea, -vomiting, -diarrhea, -constipation, -dysphagia Hematology: -bleeding or bruising problems Musculoskeletal: -arthralgias, -myalgias, -joint swelling, -back pain, - Ophthalmology: -vision changes,  Urology: -dysuria, -difficulty urinating,  -urinary frequency, -urgency, incontinence Neurology: -, -numbness, , -memory loss, -falls, -dizziness    PHYSICAL EXAM:  General Appearance: Alert, cooperative, no distress, appears stated age Head:  Normocephalic, without obvious abnormality, atraumatic Eyes: PERRL, conjunctiva/corneas clear, EOM's intact,  Ears:  Normal TM's and external ear canals Nose: Nares normal, mucosa normal, no drainage or sinus   tenderness Throat: Lips, mucosa, and tongue normal; teeth and gums normal Neck: Supple, no lymphadenopathy, thyroid:no enlargement/tenderness/nodules; no carotid bruit or JVD Lungs: Clear to auscultation bilaterally without wheezes, rales or ronchi; respirations unlabored Heart: Regular rate and rhythm, S1 and S2 normal, no murmur, rub or gallop Abdomen: Soft, non-tender, nondistended, normoactive bowel sounds, no masses, no hepatosplenomegaly Extremities: No clubbing, cyanosis or edema Pulses: 2+ and symmetric all extremities Skin: Skin color, texture, turgor normal, no rashes or lesions Lymph nodes: Cervical, supraclavicular, and axillary nodes normal Neurologic: CNII-XII intact, normal strength, sensation and gait; reflexes 2+ and symmetric throughout   Psych: Normal mood, affect, hygiene and grooming Hemoglobin A1c is 7.4 ASSESSMENT/PLAN: Diabetes mellitus type 2 with complications (HCC) - Plan: POCT glycosylated hemoglobin (Hb A1C)  Hypertension associated with diabetes (HCC)  Hyperlipidemia associated with type 2 diabetes mellitus (HCC)  Idiopathic gout, unspecified chronicity, unspecified site  Obesity (BMI 30-39.9)  Gastroesophageal reflux disease, unspecified whether esophagitis present Continue on present medication regimen.  Encouraged him to make further changes in his diet as he does seem to be exercising fairly regularly. Discussed  at least 30 minutes of aerobic activity at least 5 days/week;  healthy diet Immunization recommendations discussed.  Colonoscopy recommendations reviewed.   Medicare Attestation I have personally reviewed: The patient's medical and social history Their use of alcohol, tobacco or illicit drugs Their current medications and supplements The patient's functional ability including ADLs,fall risks, home safety risks, cognitive, and hearing and visual  impairment Diet and physical activities Evidence for depression or mood disorders  The patient's weight, height, and BMI have been recorded in the chart.  I have made referrals, counseling, and provided education to the patient based on review of the above and I have provided the patient with a written personalized care plan for preventive services.     Sharlot Gowda, MD   05/22/2020

## 2020-07-09 ENCOUNTER — Other Ambulatory Visit: Payer: Self-pay

## 2020-07-09 ENCOUNTER — Ambulatory Visit (INDEPENDENT_AMBULATORY_CARE_PROVIDER_SITE_OTHER): Payer: Medicare Other | Admitting: Family Medicine

## 2020-07-09 ENCOUNTER — Encounter: Payer: Self-pay | Admitting: Family Medicine

## 2020-07-09 VITALS — BP 110/74 | HR 91 | Temp 97.9°F | Wt 215.0 lb

## 2020-07-09 DIAGNOSIS — A6920 Lyme disease, unspecified: Secondary | ICD-10-CM

## 2020-07-09 MED ORDER — DOXYCYCLINE HYCLATE 100 MG PO TABS: 100.0000 mg | ORAL_TABLET | Freq: Two times a day (BID) | ORAL | 0 refills | Status: DC

## 2020-07-09 NOTE — Patient Instructions (Signed)
Lyme Disease Lyme disease is an infection that can affect many parts of the body, including the skin, joints, and nervous system. It is a bacterial infection that starts from the bite of an infected tick. Over time, the infection can worsen, and some of the symptoms are similar to the flu. If Lyme disease is not treated, it may cause joint pain, swelling, numbness, problems thinking, fatigue, muscle weakness, and other problems. What are the causes? This condition is caused by bacteria called Borrelia burgdorferi.  You can get Lyme disease by being bitten by an infected tick.  Only black-legged, or Ixodes, ticks that are infected with the bacteria can cause Lyme disease.  The tick must be attached to your skin for a certain period of time to pass along the infection. This is usually 36-48 hours.  Deer often carry infected ticks. What increases the risk? The following factors may make you more likely to develop this condition:  Living in or visiting these areas in the [place] ? [place]. ? The [place]. ? [place].  Spending time in wooded or grassy areas.  Being outdoors with exposed skin.  Camping, gardening, hiking, fishing, hunting, or working outdoors.  Failing to remove a tick from your skin. What are the signs or symptoms? Symptoms of this condition may include:  Chills and fever.  Headache.  Fatigue.  General achiness.  Muscle pain.  Joint pain, often in the knees.  A round, red rash that surrounds the center of the tick bite. The center of the rash may be blood colored or have tiny blisters.  Swollen lymph glands.  Stiff neck.   How is this diagnosed? This condition is diagnosed based on:  Your symptoms and medical history.  A physical exam.  A blood test. How is this treated? The main treatment for this condition is antibiotic medicine, which is usually taken by mouth (orally).  The length of treatment depends on how soon after a  tick bite you begin taking the medicine. In some cases, treatment is necessary for several weeks.  If the infection is severe, antibiotics may need to be given through an IV that is inserted into one of your veins. Follow these instructions at home:  Take over-the-counter and prescription medicines only as told by your health care provider. Finish all antibiotic medicine, even when you start to feel better.  Ask your health care provider about taking a probiotic in between doses of your antibiotic to help avoid an upset stomach or diarrhea.  Check with your health care provider before supplementing your treatment. Many alternative therapies have not been proven and may be harmful to you.  Keep all follow-up visits as told by your health care provider. This is important. How is this prevented? You can become reinfected if you get another tick bite from an infected tick. Take these steps to help prevent an infection:  Cover your skin with light-colored clothing when you are outdoors in the spring and summer months.  Spray clothing and skin with bug spray. The spray should be 20-30% DEET. You can also treat clothing with permethrin, and let it dry before you wear it. Do not apply permethrin directly to your skin. Permethrin can also be used to treat camping gear and boots. Always read and follow the instructions that come with a bug spray or insecticide.  Avoid wooded, grassy, and shaded areas.  Remove yard litter, brush, trash, and plants that attract deer and rodents.  Check yourself for   ticks when you come indoors.  Wash clothing worn each day.  Shower after spending time outdoors.  Check your pets for ticks before they come inside.  If you find a tick attached to your skin: ? Remove it with tweezers. ? Clean your hands and the bite area with rubbing alcohol or soap and water. ? Dispose of the tick by putting it in rubbing alcohol, putting it in a sealed bag or container, or  flushing it down the toilet. ? You may choose to save the tick in a sealed container if you wish for it to be tested at a later time. Pregnant women should take special care to avoid tick bites because it is possible that the infection may be passed along to the fetus.   Contact a health care provider if:  You have symptoms after treatment.  You have removed a tick and want to bring it to your health care provider for testing. Get help right away if:  You have an irregular heartbeat.  You have chest pain.  You have nerve pain.  Your face feels numb.  You develop the following: ? A stiff neck. ? A severe headache. ? Severe nausea and vomiting. ? Sensitivity to light. Summary  Lyme disease is an infection that can affect many parts of the body, including the skin, joints, and nervous system.  This condition is caused by bacteria called Borrelia burgdorferi.  You can get Lyme disease by being bitten by an infected tick.  The main treatment for this condition is antibiotic medicine. This information is not intended to replace advice given to you by your health care provider. Make sure you discuss any questions you have with your health care provider. Document Revised: 05/26/2018 Document Reviewed: 04/20/2018 Elsevier Patient Education  2021 Elsevier Inc.  

## 2020-07-09 NOTE — Progress Notes (Signed)
   Subjective:    Patient ID: Paul Long, male    DOB: 1955/10/16, 65 y.o.   MRN: [id number]  HPI He is here for evaluation of a rash that he noted on the left hip area earlier today.  He is not recall pulling any insects off of this area.  No fever, chills, malaise.   Review of Systems     Objective:   Physical Exam Exam of the left hip area does show a 4 to 5 inch erythematous round lesion with central clearing and slight bull's-eye affect.       Assessment & Plan:  Lyme disease - Plan: doxycycline (VIBRA-TABS) 100 MG tablet I think it is most consistent with Lyme disease and I will go ahead and treat him with doxycycline.  He will call if he has any difficulties.  He was comfortable with that.

## 2020-07-15 DIAGNOSIS — H5213 Myopia, bilateral: Secondary | ICD-10-CM | POA: Diagnosis not present

## 2020-07-15 LAB — HM DIABETES EYE EXAM

## 2020-07-21 ENCOUNTER — Telehealth: Payer: Self-pay

## 2020-07-21 NOTE — Telephone Encounter (Signed)
Pt states new insurance will no longer cover Paul Long preferred is Invokana & he states he took it before and had no issues with it, he only stopped because previous insurance wouldn't cover it.  [place] it supposed to only cost him $37 for 30 days. Can you switch him back to Invokana?

## 2020-07-22 MED ORDER — CANAGLIFLOZIN 300 MG PO TABS
300.0000 mg | ORAL_TABLET | Freq: Every day | ORAL | 5 refills | Status: DC
Start: 2020-07-22 — End: 2020-07-25

## 2020-07-25 ENCOUNTER — Other Ambulatory Visit: Payer: Self-pay

## 2020-07-25 ENCOUNTER — Telehealth: Payer: Self-pay | Admitting: Family Medicine

## 2020-07-25 MED ORDER — CANAGLIFLOZIN 300 MG PO TABS: 300.0000 mg | ORAL_TABLET | Freq: Every day | ORAL | 0 refills | Status: DC

## 2020-07-25 NOTE — Telephone Encounter (Signed)
Done Kh 

## 2020-07-25 NOTE — Telephone Encounter (Signed)
Patient can no longer use [facility] because of his insurance  Can you send his Invokana rx to [facility] , it was [place] [facility]

## 2020-07-28 ENCOUNTER — Other Ambulatory Visit: Payer: Self-pay | Admitting: Family Medicine

## 2020-07-28 DIAGNOSIS — E1169 Type 2 diabetes mellitus with other specified complication: Secondary | ICD-10-CM

## 2020-07-28 MED ORDER — ATORVASTATIN CALCIUM 20 MG PO TABS: 1.0000 | ORAL_TABLET | Freq: Every day | ORAL | 3 refills | Status: DC

## 2020-08-01 ENCOUNTER — Other Ambulatory Visit: Payer: Self-pay | Admitting: Family Medicine

## 2020-08-01 DIAGNOSIS — E118 Type 2 diabetes mellitus with unspecified complications: Secondary | ICD-10-CM

## 2020-08-05 ENCOUNTER — Telehealth: Payer: Self-pay | Admitting: Family Medicine

## 2020-08-05 ENCOUNTER — Other Ambulatory Visit: Payer: Self-pay

## 2020-08-05 DIAGNOSIS — E118 Type 2 diabetes mellitus with unspecified complications: Secondary | ICD-10-CM

## 2020-08-05 MED ORDER — TRULICITY 1.5 MG/0.5ML ~~LOC~~ SOAJ: 1.5000 mg | SUBCUTANEOUS | 2 refills | Status: DC

## 2020-08-05 NOTE — Telephone Encounter (Signed)
Pt left voicemail, He had to change pharmacies due to his new insurance. He needs his  Trulicity rx sent to [facility]

## 2020-08-05 NOTE — Telephone Encounter (Signed)
Done KH 

## 2020-08-12 ENCOUNTER — Other Ambulatory Visit: Payer: Self-pay | Admitting: Family Medicine

## 2020-08-12 DIAGNOSIS — R0981 Nasal congestion: Secondary | ICD-10-CM

## 2020-08-12 DIAGNOSIS — E1169 Type 2 diabetes mellitus with other specified complication: Secondary | ICD-10-CM

## 2020-08-12 DIAGNOSIS — E785 Hyperlipidemia, unspecified: Secondary | ICD-10-CM

## 2020-08-29 ENCOUNTER — Telehealth: Payer: Self-pay | Admitting: Family Medicine

## 2020-08-29 NOTE — Telephone Encounter (Signed)
Pt called and is requesting a refill on his allopurinol [facility] to [id number] [place], [place] - [address]

## 2020-09-01 ENCOUNTER — Other Ambulatory Visit: Payer: Self-pay

## 2020-09-01 MED ORDER — ALLOPURINOL 300 MG PO TABS: 300.0000 mg | ORAL_TABLET | Freq: Every day | ORAL | 0 refills | Status: DC

## 2020-09-01 NOTE — Telephone Encounter (Signed)
Done KH 

## 2020-09-20 ENCOUNTER — Other Ambulatory Visit: Payer: Self-pay | Admitting: Family Medicine

## 2020-09-22 NOTE — Progress Notes (Signed)
  Subjective:    Patient ID: Paul Long, male    DOB: 04-06-1955, 65 y.o.   MRN: [id number]  Paul Long is a 65 y.o. male who presents for follow-up of Type 2 diabetes mellitus.  Home blood sugar records: fasting range: 151 and postprandial range: 140 Current symptoms/problems include none and have been improving. Daily foot checks: yes   Any foot concerns: none at this time Exercise:  walking 30 min QD  hi is now retired Diet: good he has made some minor changes since he is now retired. He continues on Invokana, Trulicity and Actos plus and having no difficulty with that.  Continues on lisinopril, allopurinol, atorvastatin.  His allergies seem to be under good control with Flonase. The following portions of the patient's history were reviewed and updated as appropriate: allergies, current medications, past medical history, past social history and problem list.  ROS as in subjective above.     Objective:    Physical Exam Alert and in no distress otherwise not examined.  Hemoglobin A1c 7.5 Lab Review Diabetic Labs Latest Ref Rng & Units 05/22/2020 12/19/2019 07/19/2019 02/28/2019 09/22/2018  HbA1c 4.0 - 5.6 % 7.4(A) 6.6(A) 7.1(A) 7.4(A) 7.9(A)  Microalbumin mg/L - - - - <5.0  Micro/Creat Ratio 0 - 29 mg/g creat - 6 - - <6.8  Chol 100 - 199 mg/dL - 115 - - 726  HDL >20 mg/dL - 40 - - 35(D)  Calc LDL 0 - 99 mg/dL - 56 - - 54  Triglycerides 0 - 149 mg/dL - 974 - - 163(A)  Creatinine 0.76 - 1.27 mg/dL - 4.53 - - 6.46   BP/Weight 07/09/2020 05/22/2020 12/19/2019 07/19/2019 02/28/2019  Systolic BP 110 110 120 86 120  Diastolic BP 74 80 88 62 72  Wt. (Lbs) 215 215.2 215 216.8 219.6  BMI 32.69 32.72 30.85 31.11 31.51   Foot/eye exam completion dates Latest Ref Rng & Units 07/15/2020 05/22/2020  Eye Exam No Retinopathy No Retinopathy -  Foot Form Completion - - Done    Paul Long  reports that he has never smoked. He has never used smokeless tobacco. He reports that he does not drink alcohol  and does not use drugs.     Assessment & Plan:  Diabetes mellitus type 2 with complications (HCC) - Plan: POCT glycosylated hemoglobin (Hb A1C)  Hypertension associated with diabetes (HCC)  Hyperlipidemia associated with type 2 diabetes mellitus (HCC)  Obesity (BMI 30-39.9)  Idiopathic gout, unspecified chronicity, unspecified site  Allergic rhinitis, unspecified seasonality, unspecified trigger  Rx changes: none Education: Reviewed 'ABCs' of diabetes management (respective goals in parentheses):  A1C (<7), blood pressure (<130/80), and cholesterol (LDL <100). Compliance at present is estimated to be good. Efforts to improve compliance (if necessary) will be directed at dietary modifications: Discussed cutting back on carbohydrates especially in the morning trying to eat more than just cereals. . Follow up: 4 months

## 2020-09-23 ENCOUNTER — Other Ambulatory Visit: Payer: Self-pay

## 2020-09-23 ENCOUNTER — Encounter: Payer: Self-pay | Admitting: Family Medicine

## 2020-09-23 ENCOUNTER — Ambulatory Visit (INDEPENDENT_AMBULATORY_CARE_PROVIDER_SITE_OTHER): Payer: Medicare Other | Admitting: Family Medicine

## 2020-09-23 VITALS — BP 114/72 | HR 92 | Temp 97.8°F | Ht 69.0 in | Wt 211.0 lb

## 2020-09-23 DIAGNOSIS — E1169 Type 2 diabetes mellitus with other specified complication: Secondary | ICD-10-CM | POA: Diagnosis not present

## 2020-09-23 DIAGNOSIS — E785 Hyperlipidemia, unspecified: Secondary | ICD-10-CM

## 2020-09-23 DIAGNOSIS — E1159 Type 2 diabetes mellitus with other circulatory complications: Secondary | ICD-10-CM | POA: Diagnosis not present

## 2020-09-23 DIAGNOSIS — M1000 Idiopathic gout, unspecified site: Secondary | ICD-10-CM

## 2020-09-23 DIAGNOSIS — E118 Type 2 diabetes mellitus with unspecified complications: Secondary | ICD-10-CM

## 2020-09-23 DIAGNOSIS — E669 Obesity, unspecified: Secondary | ICD-10-CM | POA: Diagnosis not present

## 2020-09-23 DIAGNOSIS — I152 Hypertension secondary to endocrine disorders: Secondary | ICD-10-CM

## 2020-09-23 DIAGNOSIS — J309 Allergic rhinitis, unspecified: Secondary | ICD-10-CM

## 2020-09-23 LAB — POCT GLYCOSYLATED HEMOGLOBIN (HGB A1C): Hemoglobin A1C: 7.5 % — AB (ref 4.0–5.6)

## 2020-10-07 ENCOUNTER — Telehealth: Payer: Self-pay | Admitting: Family Medicine

## 2020-10-07 NOTE — Telephone Encounter (Signed)
Please call re Trulicity  Rx went up $200 from when he had it filled last month

## 2020-10-08 ENCOUNTER — Telehealth: Payer: Self-pay

## 2020-10-08 NOTE — Telephone Encounter (Signed)
Pt. Called stating that he went to pick up his trulicity yesterday and it has been around 27 dollars a month which is affordable, but yesterday they told  him it would be 227 dollars a month. He wanted to know if you could change it to something more affordable.

## 2020-10-08 NOTE — Telephone Encounter (Signed)
Pt. Stated he already checked with the ins. Company yesterday and they told him everything in that class was around the same price 200 and up. I told him maybe Vernona Rieger could see about a PA or patient assistance.

## 2020-10-10 NOTE — Telephone Encounter (Signed)
Called pharmacy & Trulicity went from $28 last month to $234 this month, no P.A. said probably in donut hole.  Called insurance company t# [phone] [employer] and pt is in coverage gap, he has to meet $7050 before is out, but he has already met $5000, which includes what insurance pays.  I checked and they couldn't not tell me what any other GLP-1 would cost while pt is in the donut hole but it's going to pretty much be the same.  Called pt and informed.  Will send him Pt Assistance application to complete

## 2020-10-28 ENCOUNTER — Other Ambulatory Visit: Payer: Self-pay

## 2020-10-28 ENCOUNTER — Telehealth: Payer: Self-pay

## 2020-10-28 MED ORDER — PIOGLITAZONE HCL-METFORMIN HCL 15-850 MG PO TABS: 1.0000 | ORAL_TABLET | Freq: Two times a day (BID) | ORAL | 0 refills | Status: DC

## 2020-10-28 NOTE — Telephone Encounter (Signed)
Pt left message needs refill Actos+Met to [facility]

## 2020-10-28 NOTE — Telephone Encounter (Signed)
Done KH 

## 2020-11-03 ENCOUNTER — Other Ambulatory Visit: Payer: Self-pay | Admitting: Family Medicine

## 2020-11-03 DIAGNOSIS — E118 Type 2 diabetes mellitus with unspecified complications: Secondary | ICD-10-CM

## 2020-11-11 ENCOUNTER — Other Ambulatory Visit: Payer: Self-pay | Admitting: Family Medicine

## 2020-12-01 ENCOUNTER — Telehealth: Payer: Self-pay

## 2020-12-01 MED ORDER — ALLOPURINOL 300 MG PO TABS: 300.0000 mg | ORAL_TABLET | Freq: Every day | ORAL | 1 refills | Status: DC

## 2020-12-01 NOTE — Telephone Encounter (Signed)
Pt left message needs refill Allopurinol to [facility]

## 2020-12-09 ENCOUNTER — Other Ambulatory Visit (INDEPENDENT_AMBULATORY_CARE_PROVIDER_SITE_OTHER): Payer: Medicare Other

## 2020-12-09 ENCOUNTER — Other Ambulatory Visit: Payer: Self-pay

## 2020-12-09 DIAGNOSIS — Z23 Encounter for immunization: Secondary | ICD-10-CM

## 2020-12-30 ENCOUNTER — Telehealth: Payer: Self-pay

## 2020-12-30 NOTE — Telephone Encounter (Signed)
Called pt to check on Pt Assistance application Trulicity & he states he mailed it in himself.  Advised we have samples to hold him

## 2020-12-31 NOTE — Telephone Encounter (Signed)
Faxed provider portion of Pt Assistance application

## 2021-01-02 NOTE — Telephone Encounter (Signed)
Pt came by office and was given sample Trulicity, providers portion of Pt Assistance application was completed and faxed

## 2021-01-26 ENCOUNTER — Ambulatory Visit (INDEPENDENT_AMBULATORY_CARE_PROVIDER_SITE_OTHER): Payer: Medicare Other | Admitting: Family Medicine

## 2021-01-26 ENCOUNTER — Other Ambulatory Visit: Payer: Self-pay

## 2021-01-26 ENCOUNTER — Encounter: Payer: Self-pay | Admitting: Family Medicine

## 2021-01-26 VITALS — BP 116/78 | HR 95 | Temp 97.9°F | Wt 201.6 lb

## 2021-01-26 DIAGNOSIS — E1159 Type 2 diabetes mellitus with other circulatory complications: Secondary | ICD-10-CM | POA: Diagnosis not present

## 2021-01-26 DIAGNOSIS — E1169 Type 2 diabetes mellitus with other specified complication: Secondary | ICD-10-CM

## 2021-01-26 DIAGNOSIS — E669 Obesity, unspecified: Secondary | ICD-10-CM

## 2021-01-26 DIAGNOSIS — Z23 Encounter for immunization: Secondary | ICD-10-CM

## 2021-01-26 DIAGNOSIS — E118 Type 2 diabetes mellitus with unspecified complications: Secondary | ICD-10-CM | POA: Diagnosis not present

## 2021-01-26 DIAGNOSIS — K219 Gastro-esophageal reflux disease without esophagitis: Secondary | ICD-10-CM

## 2021-01-26 DIAGNOSIS — E785 Hyperlipidemia, unspecified: Secondary | ICD-10-CM

## 2021-01-26 DIAGNOSIS — M1000 Idiopathic gout, unspecified site: Secondary | ICD-10-CM

## 2021-01-26 DIAGNOSIS — I152 Hypertension secondary to endocrine disorders: Secondary | ICD-10-CM

## 2021-01-26 LAB — POCT GLYCOSYLATED HEMOGLOBIN (HGB A1C): Hemoglobin A1C: 6.7 % — AB (ref 4.0–5.6)

## 2021-01-26 LAB — POCT UA - MICROALBUMIN
Albumin/Creatinine Ratio, Urine, POC: 7.8
Creatinine, POC: 86.7 mg/dL
Microalbumin Ur, POC: 6.8 mg/L

## 2021-01-26 NOTE — Progress Notes (Signed)
  Subjective:    Patient ID: Paul Long, male    DOB: 10/05/1955, 65 y.o.   MRN: [id number]  Paul Long is a 65 y.o. male who presents for follow-up of Type 2 diabetes mellitus.  Home blood sugar records:  post meal lowest reading 130- highest 150 Current symptoms/problems include none and have been stable. Daily foot checks: yes  Any foot concerns: none Exercise:  staying active 20 min a day Diet: He has made some dietary changes cutting back on fast foods and high calorie foods.  He continues on Actos plus and Trulicity.  Apparently Invokana is not going to be covered by his insurance as of next year.  Continues on allopurinol and is having no difficulty with that.  He does note that he is having less aches and pains since he is lost some weight.  He is also contemplating getting back into the dating world.  Apparently he has reconnected with an old girlfriend. The following portions of the patient's history were reviewed and updated as appropriate: allergies, current medications, past medical history, past social history and problem list.  ROS as in subjective above.     Objective:    Physical Exam Alert and in no distress otherwise not examined. Hemoglobin A1c is 6.7 Blood pressure 116/78, pulse 95, temperature 97.9 F (36.6 C), weight 201 lb 9.6 oz (91.4 kg), SpO2 97 %.  Lab Review Diabetic Labs Latest Ref Rng & Units 09/23/2020 05/22/2020 12/19/2019 07/19/2019 02/28/2019  HbA1c 4.0 - 5.6 % 7.5(A) 7.4(A) 6.6(A) 7.1(A) 7.4(A)  Microalbumin mg/L - - - - -  Micro/Creat Ratio 0 - 29 mg/g creat - - 6 - -  Chol 100 - 199 mg/dL - - 716 - -  HDL >96 mg/dL - - 40 - -  Calc LDL 0 - 99 mg/dL - - 56 - -  Triglycerides 0 - 149 mg/dL - - 789 - -  Creatinine 0.76 - 1.27 mg/dL - - 3.81 - -   BP/Weight 01/26/2021 09/23/2020 07/09/2020 05/22/2020 12/19/2019  Systolic BP 116 114 110 110 120  Diastolic BP 78 72 74 80 88  Wt. (Lbs) 201.6 211 215 215.2 215  BMI 29.77 31.16 32.69 32.72 30.85    Foot/eye exam completion dates Latest Ref Rng & Units 07/15/2020 05/22/2020  Eye Exam No Retinopathy No Retinopathy -  Foot Form Completion - - Done    Virgal  reports that he has never smoked. He has never used smokeless tobacco. He reports that he does not drink alcohol and does not use drugs.     Assessment & Plan:    Hypertension associated with diabetes (HCC)  Gastroesophageal reflux disease, unspecified whether esophagitis present  Diabetes mellitus type 2 with complications (HCC) - Plan: POCT UA - Microalbumin, POCT glycosylated hemoglobin (Hb A1C)  Hyperlipidemia associated with type 2 diabetes mellitus (HCC)  Idiopathic gout, unspecified chronicity, unspecified site  Obesity (BMI 30-39.9)  Need for COVID-19 vaccine - Plan: [employer], [employer]  Rx changes: none Education: Reviewed 'ABCs' of diabetes management (respective goals in parentheses):  A1C (<7), blood pressure (<130/80), and cholesterol (LDL <100). Compliance at present is estimated to be excellent. Efforts to improve compliance (if necessary) will be directed at  no change . Follow up: 6 months

## 2021-01-28 ENCOUNTER — Other Ambulatory Visit: Payer: Self-pay | Admitting: Family Medicine

## 2021-01-30 ENCOUNTER — Telehealth (INDEPENDENT_AMBULATORY_CARE_PROVIDER_SITE_OTHER): Payer: Medicare Other | Admitting: Family Medicine

## 2021-01-30 ENCOUNTER — Encounter: Payer: Self-pay | Admitting: Family Medicine

## 2021-01-30 ENCOUNTER — Other Ambulatory Visit: Payer: Self-pay

## 2021-01-30 VITALS — Temp 99.0°F | Wt 201.0 lb

## 2021-01-30 DIAGNOSIS — U071 COVID-19: Secondary | ICD-10-CM | POA: Diagnosis not present

## 2021-01-30 NOTE — Progress Notes (Signed)
° °  Subjective:    Patient ID: Paul Long, male    DOB: 26-Aug-1955, 65 y.o.   MRN: [id number]  HPI Documentation for virtual audio and video telecommunications through Caregility encounter: The patient was located at home. 2 patient identifiers used.  The provider was located in the office. The patient did consent to this visit and is aware of possible charges through their insurance for this visit.  The other persons participating in this telemedicine service were none. Time spent on call was 5 minutes and in review of previous records >17 minutes total for counseling and coordination of care.  This virtual service is not related to other E/M service within previous 7 days.  He states that Tuesday he developed some sneezing, slight cough, rhinorrhea and nasal congestion and then found out that he had been exposed to COVID.  Several family members tested positive and were placed on medications.  He tested for COVID on Thursday and was positive.  His symptoms have not worsened from Tuesday.  Review of Systems     Objective:   Physical Exam Alert and in no distress otherwise not examined       Assessment & Plan:   COVID-19 I explained that he is certainly at risk but since he is really having minimal symptoms from it, put him on a medication is not absolutely necessary.  Discussed treating his symptoms with decongestants, antihistamine, cough suppressant, Tylenol.  Explained that if he gets worse in terms of worsening fever, cough and shortness of breath to call me.  I said that we have 5 days window to decide what to do.  As long as he is getting better by the end of the weekend, he can then go out in public with a mask for another 5 days.  He expressed understanding of this.

## 2021-02-03 ENCOUNTER — Other Ambulatory Visit: Payer: Self-pay

## 2021-02-03 ENCOUNTER — Telehealth: Payer: Self-pay | Admitting: Family Medicine

## 2021-02-03 DIAGNOSIS — E1159 Type 2 diabetes mellitus with other circulatory complications: Secondary | ICD-10-CM

## 2021-02-03 MED ORDER — LISINOPRIL 10 MG PO TABS
10.0000 mg | ORAL_TABLET | Freq: Every day | ORAL | 0 refills | Status: DC
Start: 2021-02-03 — End: 2021-06-02

## 2021-02-03 NOTE — Telephone Encounter (Signed)
Done KH 

## 2021-02-03 NOTE — Telephone Encounter (Signed)
Pt called and is requesting a refill on his lisinopril please send to the [facility] - [place], [place] - [address]

## 2021-02-19 ENCOUNTER — Other Ambulatory Visit: Payer: Self-pay | Admitting: Family Medicine

## 2021-02-19 DIAGNOSIS — E1169 Type 2 diabetes mellitus with other specified complication: Secondary | ICD-10-CM

## 2021-02-19 MED ORDER — ATORVASTATIN CALCIUM 20 MG PO TABS: 20.0000 mg | ORAL_TABLET | Freq: Every day | ORAL | 3 refills | Status: DC

## 2021-04-27 ENCOUNTER — Other Ambulatory Visit: Payer: Self-pay | Admitting: Family Medicine

## 2021-04-30 ENCOUNTER — Telehealth: Payer: Self-pay

## 2021-04-30 NOTE — Telephone Encounter (Signed)
P.A. INVOKANA was denied, pt needs trial of Jardiance, can pt be switched?

## 2021-05-20 MED ORDER — EMPAGLIFLOZIN 10 MG PO TABS: 10.0000 mg | ORAL_TABLET | Freq: Every day | ORAL | 0 refills | Status: DC

## 2021-05-20 NOTE — Telephone Encounter (Signed)
Spoke with Pt and he is running out of samples Invokana, sent in [place] to [facility]. Pt has appt this month

## 2021-05-25 ENCOUNTER — Encounter: Payer: Self-pay | Admitting: Family Medicine

## 2021-05-25 ENCOUNTER — Ambulatory Visit (INDEPENDENT_AMBULATORY_CARE_PROVIDER_SITE_OTHER): Payer: Medicare Other | Admitting: Family Medicine

## 2021-05-25 VITALS — BP 118/72 | HR 80 | Temp 97.3°F | Ht 69.0 in | Wt 198.8 lb

## 2021-05-25 DIAGNOSIS — S70261A Insect bite (nonvenomous), right hip, initial encounter: Secondary | ICD-10-CM | POA: Diagnosis not present

## 2021-05-25 DIAGNOSIS — W57XXXA Bitten or stung by nonvenomous insect and other nonvenomous arthropods, initial encounter: Secondary | ICD-10-CM

## 2021-05-25 NOTE — Patient Instructions (Signed)
?  Since the redness around the tick bite has not enlarged or changed since you first noticed it, I do not suspect there is an infection of the skin (cellulitis). ?There is a central scab, which potentially could be an area that could introduce bacterial, and develop an infection. ?Keep the area clean, and use antibacterial ointment 2x/day (bacitracin, for example). ? ?If you notice increase in the size of the redness, pain, fever, or red streaks, please contact us for an antibiotic to treat a cellulitis. ?If you notice a bulls eye rash, any fever, body aches, joint pains, severe headache, or other new symptoms, please reach out to [place]. ?We would want to initiate doxycycline if any evidence to suggest Lyme disease or [facility] Spotted Fever ?

## 2021-05-25 NOTE — Progress Notes (Signed)
Chief Complaint  ?Patient presents with  ? Insect Bite  ?  Tick bite about a week ago right hip area. Where the tick bit patient is black. The area around it is red and swollen and itchy.   ? ? ?4/4 or 4/5 he found a tick at the R hip.   ?It pulled off easily, was engorged.  Cleaned with alcohol and soap and water. ?He noticed some redness immediately after removing the tick. He denies any significant change to appearance since noticing this last week--no increase in size of redness. ?Now itches some.  ? ?No fever, chills, body aches, joint aches, other rashes. ? ?Last year he had a tick bite, developed a bullseye rash, and was treated with doxycycline. ? ? ?PMH, PSH, SH reviewed ? ?Outpatient Encounter Medications as of 05/25/2021  ?Medication Sig Note  ? allopurinol (ZYLOPRIM) 300 MG tablet Take 1 tablet (300 mg total) by mouth daily.   ? atorvastatin (LIPITOR) 20 MG tablet Take 1 tablet (20 mg total) by mouth daily.   ? Coenzyme Q10 (CO Q 10 PO) Take by mouth.   ? fish oil-omega-3 fatty acids 1000 MG capsule Take 2 g by mouth daily.   ? fluticasone (FLONASE) 50 MCG/ACT nasal spray SPRAY 2 SPRAYS INTO EACH NOSTRIL EVERY DAY   ? glucose blood (FREESTYLE LITE) test strip USE TO TEST DAILY   ? Lancets (FREESTYLE) lancets USE AS DIRECTED   ? MAGNESIUM PO Take by mouth.   ? Multiple Vitamins-Minerals (MULTIVITAMIN WITH MINERALS) tablet Take 1 tablet by mouth daily.   ? pioglitazone-metformin (ACTOPLUS MET) 15-850 MG tablet TAKE 1 TABLET BY MOUTH TWICE DAILY   ? TRULICITY 1.5 DX/8.3JA SOPN INJECT 1.5 MGS SUBCUTANEOUSLY ONCE WEEKLY   ? doxycycline (VIBRA-TABS) 100 MG tablet Take 1 tablet (100 mg total) by mouth 2 (two) times daily. (Patient not taking: Reported on 09/23/2020)   ? empagliflozin (JARDIANCE) 10 MG TABS tablet Take 1 tablet (10 mg total) by mouth daily before breakfast. (Patient not taking: Reported on 05/25/2021) 05/25/2021: Has not started yet  ? lisinopril (ZESTRIL) 10 MG tablet Take 1 tablet (10 mg total) by  mouth daily. (Patient not taking: Reported on 05/25/2021)   ? ?Facility-Administered Encounter Medications as of 05/25/2021  ?Medication  ? 0.9 %  sodium chloride infusion  ? ?No Known Allergies ? ?ROS: no fever, chills, myalgia/arthralgia, n/v/d.  Rash per HPI--redness at tick bite, no rash elsewhere on body.  No URI symptoms, chest pain, shortness of breath. ? ? ?PHYSICAL EXAM: ? ?BP 118/72   Pulse 80   Temp (!) 97.3 ?F (36.3 ?C) (Tympanic)   Ht _0  (1.753 m)   Wt 198 lb 12.8 oz (90.2 kg)   BMI 29.36 kg/m?  ? ?Well-appearing, pleasant male in no distress ?R lateral hip ?3x3cm area of erythema surrounding a 52m central area--appears to be a scab on close inspection.  Not unroofed to examine for any remaining tick parts. ?Slightly indurated x 1 cm around this central area ?No streaks. ?No target or bulls-eye appearance ? ?During visit he was noted to be bleeding --scraped the base of the R thumb during visit--bacitracin and bandage applied. ? ? ?ASSESSMENT/PLAN: ? ?Tick bite of right hip, initial encounter - approx date first noticed 4/4 or 4/5.  Reviewed s/sx cellulitis and tick-borne illnesses. Topical antibacterial ointment and monitor for now ? ?Since the redness around the tick bite has not enlarged or changed since you first noticed it, I do not suspect there is  an infection of the skin (cellulitis). ?There is a central scab, which potentially could be an area that could introduce bacterial, and develop an infection. ?Keep the area clean, and use antibacterial ointment 2x/day (bacitracin, for example). ? ?If you notice increase in the size of the redness, pain, fever, or red streaks, please contact us for an antibiotic to treat a cellulitis. ?If you notice a bulls eye rash, any fever, body aches, joint pains, severe headache, or other new symptoms, please reach out to [place]. ?We would want to initiate doxycycline if any evidence to suggest Lyme disease or [facility] Spotted Fever ? ? ? ?

## 2021-05-27 ENCOUNTER — Telehealth: Payer: Self-pay

## 2021-05-27 NOTE — Telephone Encounter (Signed)
Called pharmacy after I sent it in to make sure there were no issues with insurance and pt picked up Jardiance went thru for $37

## 2021-06-02 ENCOUNTER — Encounter: Payer: Self-pay | Admitting: Family Medicine

## 2021-06-02 ENCOUNTER — Ambulatory Visit (INDEPENDENT_AMBULATORY_CARE_PROVIDER_SITE_OTHER): Payer: Medicare Other | Admitting: Family Medicine

## 2021-06-02 VITALS — BP 100/68 | HR 97 | Temp 97.2°F | Ht 69.0 in | Wt 197.8 lb

## 2021-06-02 DIAGNOSIS — E669 Obesity, unspecified: Secondary | ICD-10-CM

## 2021-06-02 DIAGNOSIS — E118 Type 2 diabetes mellitus with unspecified complications: Secondary | ICD-10-CM

## 2021-06-02 DIAGNOSIS — E1169 Type 2 diabetes mellitus with other specified complication: Secondary | ICD-10-CM | POA: Diagnosis not present

## 2021-06-02 DIAGNOSIS — M1000 Idiopathic gout, unspecified site: Secondary | ICD-10-CM

## 2021-06-02 DIAGNOSIS — J309 Allergic rhinitis, unspecified: Secondary | ICD-10-CM

## 2021-06-02 DIAGNOSIS — E1159 Type 2 diabetes mellitus with other circulatory complications: Secondary | ICD-10-CM | POA: Diagnosis not present

## 2021-06-02 DIAGNOSIS — I152 Hypertension secondary to endocrine disorders: Secondary | ICD-10-CM | POA: Diagnosis not present

## 2021-06-02 DIAGNOSIS — K219 Gastro-esophageal reflux disease without esophagitis: Secondary | ICD-10-CM | POA: Diagnosis not present

## 2021-06-02 DIAGNOSIS — Z0000 Encounter for general adult medical examination without abnormal findings: Secondary | ICD-10-CM | POA: Diagnosis not present

## 2021-06-02 DIAGNOSIS — E785 Hyperlipidemia, unspecified: Secondary | ICD-10-CM

## 2021-06-02 LAB — POCT GLYCOSYLATED HEMOGLOBIN (HGB A1C): Hemoglobin A1C: 6.7 % — AB (ref 4.0–5.6)

## 2021-06-02 LAB — POCT UA - MICROALBUMIN
Albumin/Creatinine Ratio, Urine, POC: 7.9
Creatinine, POC: 68.6 mg/dL
Microalbumin Ur, POC: 5.4 mg/L

## 2021-06-02 MED ORDER — ALLOPURINOL 300 MG PO TABS: 300.0000 mg | ORAL_TABLET | Freq: Every day | ORAL | 3 refills | Status: DC

## 2021-06-02 MED ORDER — PIOGLITAZONE HCL-METFORMIN HCL 15-850 MG PO TABS: 1.0000 | ORAL_TABLET | Freq: Two times a day (BID) | ORAL | 1 refills | Status: DC

## 2021-06-02 MED ORDER — TRULICITY 1.5 MG/0.5ML ~~LOC~~ SOAJ: SUBCUTANEOUS | 1 refills | Status: DC

## 2021-06-02 MED ORDER — LISINOPRIL 5 MG PO TABS: 5.0000 mg | ORAL_TABLET | Freq: Every day | ORAL | 3 refills | Status: DC

## 2021-06-02 MED ORDER — ATORVASTATIN CALCIUM 20 MG PO TABS: 20.0000 mg | ORAL_TABLET | Freq: Every day | ORAL | 3 refills | Status: DC

## 2021-06-02 MED ORDER — EMPAGLIFLOZIN 10 MG PO TABS: 10.0000 mg | ORAL_TABLET | Freq: Every day | ORAL | 1 refills | Status: DC

## 2021-06-02 NOTE — Progress Notes (Signed)
? ?  Subjective:  ? ? Patient ID: Paul Long, male    DOB: 1955/05/20, 66 y.o.   MRN: [id number] ? ?HPI ?He is here for complete examination.  He is now retired and is taking much better care of himself in terms of diet and exercise.  He has lost several pounds because of this.  Continues on his diabetes related medications and having no difficulty with that.  He does think that the lisinopril was causing difficulty with his blood pressure and stopped taking it.  He does have gout and continues on allopurinol.  He has had difficulty with nasal congestion but has been using Flonase.  The congestion usually occurs only at night.  He also intermittently has had some reflux symptoms and usually uses Tums.  He is dating someone and this seems to be going well.  Presently he is not sexually active.  Otherwise he has no particular concerns or complaints. ? ? ?Review of Systems  ?All other systems reviewed and are negative. ? ?   ?Objective:  ? Physical Exam ?Alert and in no distress. Tympanic membranes and canals are normal. Pharyngeal area is normal. Neck is supple without adenopathy or thyromegaly. Cardiac exam shows a regular sinus rhythm without murmurs or gallops. Lungs are clear to auscultation. ?Foot exam is normal. ?Hemoglobin A1c is 6.7 ? ? ?   ?Assessment & Plan:  ?Routine general medical examination at a health care facility - Plan: CBC with Differential/Platelet, Comprehensive metabolic panel, Lipid panel ? ?Hyperlipidemia associated with type 2 diabetes mellitus ([place]) - Plan: Lipid panel, atorvastatin (LIPITOR) 20 MG tablet ? ?Hypertension associated with diabetes ([place]) - Plan: CBC with Differential/Platelet, Comprehensive metabolic panel, lisinopril (ZESTRIL) 5 MG tablet ? ?Gastroesophageal reflux disease, unspecified whether esophagitis present ? ?Diabetes mellitus type 2 with complications ([place]) - Plan: CBC with Differential/Platelet, Comprehensive metabolic panel, Lipid panel, POCT glycosylated  hemoglobin (Hb A1C), POCT UA - Microalbumin, empagliflozin (JARDIANCE) 10 MG TABS tablet, pioglitazone-metformin (ACTOPLUS MET) 15-850 MG tablet, Dulaglutide (TRULICITY) 1.5 HE/1.7EY SOPN ? ?Obesity (BMI 30-39.9) ? ?Idiopathic gout, unspecified chronicity, unspecified site - Plan: Uric Acid, allopurinol (ZYLOPRIM) 300 MG tablet ? ?Allergic rhinitis, unspecified seasonality, unspecified trigger ?I decreased his lisinopril to 5 mg.  Explained the need to use this for kidney preservation.  Encouraged him to continue with his diet and exercise.  He will call me if he becomes sexually active and has difficulty with ED. continue to treat the reflux on an as-needed basis.  Did recommend that he use Flonase after he has used [place] to make sure he is applying the steroid to the mucosal.  Hopefully this will help with his nasal congestion. ? ?

## 2021-06-02 NOTE — Progress Notes (Deleted)
Paul Long is a 66 y.o. male who presents for annual wellness visit and follow-up on chronic medical conditions.  He has the following concerns: ? ? ?Immunizations and Health Maintenance ?Immunization History  ?Administered Date(s) Administered  ? Fluad Quad(high Dose 65+) 12/09/2020  ? Influenza Split 11/30/2010, 12/03/2011  ? Influenza Whole 12/02/2006, 11/03/2007, 11/11/2009  ? Influenza,inj,Quad PF,6+ Mos 12/08/2012, 10/31/2013, 11/19/2014, 12/05/2015, 11/04/2016, 11/24/2017, 10/24/2018, 12/19/2019  ? PFIZER(Purple Top)SARS-COV-2 Vaccination 04/30/2019, 05/21/2019, 12/19/2019  ? Research officer, trade [employer] 72yrs & up 01/26/2021  ? Pneumococcal Conjugate-13 10/31/2013  ? Pneumococcal Polysaccharide-23 05/02/2015  ? Tdap 09/24/2009, 02/28/2019  ? Zoster Recombinat (Shingrix) 05/11/2016, 11/04/2016  ? Zoster, Live 05/02/2015  ? ?Health Maintenance Due  ?Topic Date Due  ? FOOT EXAM  05/22/2021  ? ? ?Last colonoscopy: ?Last PSA: ?Dentist: ?Ophtho: ?Exercise: ? ?Other doctors caring for patient include: ? ?Advanced Directives: ?  ? ?Depression screen:  See questionnaire below.   ?  ? ?  05/25/2021  ?  1:21 PM 05/22/2020  ?  8:29 AM 09/22/2018  ?  8:19 AM 05/11/2016  ?  8:19 AM 01/13/2015  ?  8:28 AM  ?Depression screen PHQ 2/9  ?Decreased Interest 0 0 0 0 0  ?Down, Depressed, Hopeless 0 0 0 0 0  ?PHQ - 2 Score 0 0 0 0 0  ? ? ?Fall Screen: See Questionaire below. ?  ? ?  05/25/2021  ?  1:20 PM 05/22/2020  ?  8:28 AM 05/11/2016  ?  8:19 AM 01/13/2015  ?  8:28 AM  ?Fall Risk   ?Falls in the past year? 0 0 No Yes  ?Number falls in past yr: 0 0  1  ?Injury with Fall? 0 0  Yes  ?Risk for fall due to : No Fall Risks No Fall Risks  Other (Comment)  ?Follow up Falls evaluation completed Falls evaluation completed  Falls evaluation completed  ? ? ?ADL screen:  See questionnaire below.  ?Functional Status Survey: ?  ? ? ?Review of Systems ? ?Constitutional: -, -unexpected weight change, -anorexia, -fatigue ?Allergy:  -sneezing, -itching, -congestion ?Dermatology: denies changing moles, rash, lumps ?ENT: -runny nose, -ear pain, -sore throat,  ?Cardiology:  -chest pain, -palpitations, -orthopnea, ?Respiratory: -cough, -shortness of breath, -dyspnea on exertion, -wheezing,  ?Gastroenterology: -abdominal pain, -nausea, -vomiting, -diarrhea, -constipation, -dysphagia ?Hematology: -bleeding or bruising problems ?Musculoskeletal: -arthralgias, -myalgias, -joint swelling, -back pain, - ?Ophthalmology: -vision changes,  ?Urology: -dysuria, -difficulty urinating,  -urinary frequency, -urgency, incontinence ?Neurology: -, -numbness, , -memory loss, -falls, -dizziness ? ? ? ?PHYSICAL EXAM: ? ?There were no vitals taken for this visit. ? ?General Appearance: Alert, cooperative, no distress, appears stated age ?Head: Normocephalic, without obvious abnormality, atraumatic ?Eyes: PERRL, conjunctiva/corneas clear, EOM's intact, fundi benign ?Ears: Normal TM's and external ear canals ?Nose: Nares normal, mucosa normal, no drainage or sinus   tenderness ?Throat: Lips, mucosa, and tongue normal; teeth and gums normal ?Neck: Supple, no lymphadenopathy, thyroid:no enlargement/tenderness/nodules; no carotid bruit or JVD ?Lungs: Clear to auscultation bilaterally without wheezes, rales or ronchi; respirations unlabored ?Heart: Regular rate and rhythm, S1 and S2 normal, no murmur, rub or gallop ?Abdomen: Soft, non-tender, nondistended, normoactive bowel sounds, no masses, no hepatosplenomegaly ?Extremities: No clubbing, cyanosis or edema ?Pulses: 2+ and symmetric all extremities ?Skin: Skin color, texture, turgor normal, no rashes or lesions ?Lymph nodes: Cervical, supraclavicular, and axillary nodes normal ?Neurologic: CNII-XII intact, normal strength, sensation and gait; reflexes 2+ and symmetric throughout   ?Psych: Normal mood, affect, hygiene and grooming ? ?  ASSESSMENT/PLAN: ? ? ? ?Discussed PSA screening (risks/benefits), recommended at least 30  minutes of aerobic activity at least 5 days/week; proper sunscreen use reviewed; healthy diet and alcohol recommendations (less than or equal to 2 drinks/day) reviewed; regular seatbelt use; changing batteries in smoke detectors. Immunization recommendations discussed.  Colonoscopy recommendations reviewed. ? ? ?Medicare Attestation ?I have personally reviewed: ?The patient's medical and social history ?Their use of alcohol, tobacco or illicit drugs ?Their current medications and supplements ?The patient's functional ability including ADLs,fall risks, home safety risks, cognitive, and hearing and visual impairment ?Diet and physical activities ?Evidence for depression or mood disorders ? ?The patient's weight, height, and BMI have been recorded in the chart.  I have made referrals, counseling, and provided education to the patient based on review of the above and I have provided the patient with a written personalized care plan for preventive services.   ? ? ?Sharlot Gowda, MD   06/02/2021  ? ? ? ?

## 2021-06-03 LAB — COMPREHENSIVE METABOLIC PANEL
ALT: 37 IU/L (ref 0–44)
AST: 28 IU/L (ref 0–40)
Albumin/Globulin Ratio: 2.3 — ABNORMAL HIGH (ref 1.2–2.2)
Albumin: 5 g/dL — ABNORMAL HIGH (ref 3.8–4.8)
Alkaline Phosphatase: 111 IU/L (ref 44–121)
BUN/Creatinine Ratio: 14 (ref 10–24)
BUN: 11 mg/dL (ref 8–27)
Bilirubin Total: 1.3 mg/dL — ABNORMAL HIGH (ref 0.0–1.2)
CO2: 23 mmol/L (ref 20–29)
Calcium: 9.7 mg/dL (ref 8.6–10.2)
Chloride: 103 mmol/L (ref 96–106)
Creatinine, Ser: 0.76 mg/dL (ref 0.76–1.27)
Globulin, Total: 2.2 g/dL (ref 1.5–4.5)
Glucose: 113 mg/dL — ABNORMAL HIGH (ref 70–99)
Potassium: 4.5 mmol/L (ref 3.5–5.2)
Sodium: 143 mmol/L (ref 134–144)
Total Protein: 7.2 g/dL (ref 6.0–8.5)
eGFR: 99 mL/min/{1.73_m2} (ref >59)

## 2021-06-03 LAB — LIPID PANEL
Chol/HDL Ratio: 2.6 ratio (ref 0.0–5.0)
Cholesterol, Total: 118 mg/dL (ref 100–199)
HDL: 45 mg/dL (ref >39)
LDL Chol Calc (NIH): 51 mg/dL (ref 0–99)
Triglycerides: 127 mg/dL (ref 0–149)
VLDL Cholesterol Cal: 22 mg/dL (ref 5–40)

## 2021-06-03 LAB — CBC WITH DIFFERENTIAL/PLATELET
Basophils Absolute: 0.1 10*3/uL (ref 0.0–0.2)
Basos: 1 %
EOS (ABSOLUTE): 0.2 10*3/uL (ref 0.0–0.4)
Eos: 2 %
Hematocrit: 51.4 % — ABNORMAL HIGH (ref 37.5–51.0)
Hemoglobin: 18.2 g/dL — ABNORMAL HIGH (ref 13.0–17.7)
Immature Grans (Abs): 0 10*3/uL (ref 0.0–0.1)
Immature Granulocytes: 0 %
Lymphocytes Absolute: 2.4 10*3/uL (ref 0.7–3.1)
Lymphs: 31 %
MCH: 33.4 pg — ABNORMAL HIGH (ref 26.6–33.0)
MCHC: 35.4 g/dL (ref 31.5–35.7)
MCV: 94 fL (ref 79–97)
Monocytes Absolute: 0.4 10*3/uL (ref 0.1–0.9)
Monocytes: 6 %
Neutrophils Absolute: 4.6 10*3/uL (ref 1.4–7.0)
Neutrophils: 60 %
Platelets: 282 10*3/uL (ref 150–450)
RBC: 5.45 x10E6/uL (ref 4.14–5.80)
RDW: 12.8 % (ref 11.6–15.4)
WBC: 7.8 10*3/uL (ref 3.4–10.8)

## 2021-06-03 LAB — URIC ACID: Uric Acid: 3.1 mg/dL — ABNORMAL LOW (ref 3.8–8.4)

## 2021-06-03 NOTE — Addendum Note (Signed)
Addended by: Ronnald Nian on: 06/03/2021 08:29 AM ? ? Modules accepted: Orders ? ?

## 2021-06-09 ENCOUNTER — Telehealth (INDEPENDENT_AMBULATORY_CARE_PROVIDER_SITE_OTHER): Payer: Medicare Other | Admitting: Family Medicine

## 2021-06-09 ENCOUNTER — Other Ambulatory Visit: Payer: Medicare Other

## 2021-06-09 VITALS — BP 118/72 | Wt 197.0 lb

## 2021-06-09 DIAGNOSIS — I152 Hypertension secondary to endocrine disorders: Secondary | ICD-10-CM

## 2021-06-09 DIAGNOSIS — E118 Type 2 diabetes mellitus with unspecified complications: Secondary | ICD-10-CM

## 2021-06-09 DIAGNOSIS — E669 Obesity, unspecified: Secondary | ICD-10-CM

## 2021-06-09 DIAGNOSIS — E1169 Type 2 diabetes mellitus with other specified complication: Secondary | ICD-10-CM | POA: Diagnosis not present

## 2021-06-09 DIAGNOSIS — Z0000 Encounter for general adult medical examination without abnormal findings: Secondary | ICD-10-CM | POA: Diagnosis not present

## 2021-06-09 DIAGNOSIS — K219 Gastro-esophageal reflux disease without esophagitis: Secondary | ICD-10-CM

## 2021-06-09 DIAGNOSIS — E785 Hyperlipidemia, unspecified: Secondary | ICD-10-CM

## 2021-06-09 DIAGNOSIS — E1159 Type 2 diabetes mellitus with other circulatory complications: Secondary | ICD-10-CM

## 2021-06-09 NOTE — Progress Notes (Signed)
Paul Long is a 66 y.o. male who presents for annual wellness visit .Documentation for virtual audio and video telecommunications through [place] encounter: ? ?The patient was located at home. 2 patient identifiers used.  ?The provider was located in the office. ?The patient did consent to this visit and is aware of possible charges through their insurance for this visit. ? ?The other persons participating in this telemedicine service were none. ?Time spent on call was 5 minutes and in review of previous records >25 minutes total for counseling and coordination of care. ?This virtual service is not related to other E/M service within previous 7 days.  ?He does have underlying diabetes and is taking good care of himself exercising regularly and staying on his medications.  His gout is under good control on allopurinol.  His allergies are causing no difficulties.  His weight is stable.  He continues on low-dose lisinopril, Actos plus, allopurinol and Lipitor. ? ?Immunizations and Health Maintenance ?Immunization History  ?Administered Date(s) Administered  ? Fluad Quad(high Dose 65+) 12/09/2020  ? Influenza Split 11/30/2010, 12/03/2011  ? Influenza Whole 12/02/2006, 11/03/2007, 11/11/2009  ? Influenza,inj,Quad PF,6+ Mos 12/08/2012, 10/31/2013, 11/19/2014, 12/05/2015, 11/04/2016, 11/24/2017, 10/24/2018, 12/19/2019  ? PFIZER(Purple Top)SARS-COV-2 Vaccination 04/30/2019, 05/21/2019, 12/19/2019  ? Research officer, trade union 76yrs & up 01/26/2021  ? Pneumococcal Conjugate-13 10/31/2013  ? Pneumococcal Polysaccharide-23 05/02/2015  ? Tdap 09/24/2009, 02/28/2019  ? Zoster Recombinat (Shingrix) 05/11/2016, 11/04/2016  ? Zoster, Live 05/02/2015  ? ?There are no preventive care reminders to display for this patient. ? ?Last colonoscopy: 12/12/15 Dr. Anselm Jungling ?Last PSA: 07/26/17 ( 0.3) ?Dentist: Q six months ?Ophtho: Q year ?Exercise: walking 5 days a week ? ?Other doctors caring for patient include: Dr.  Russella Dar [facility] ?    ? ?Advanced Directives: ?Does Patient Have a Medical Advance Directive?: Yes ?Type of Advance Directive: Living will ?Does patient want to make changes to medical advance directive?: No - Patient declined ? ?Depression screen:  See questionnaire below.   ?  ? ?  06/09/2021  ?  3:58 PM 05/25/2021  ?  1:21 PM 05/22/2020  ?  8:29 AM 09/22/2018  ?  8:19 AM 05/11/2016  ?  8:19 AM  ?Depression screen PHQ 2/9  ?Decreased Interest 0 0 0 0 0  ?Down, Depressed, Hopeless 0 0 0 0 0  ?PHQ - 2 Score 0 0 0 0 0  ? ? ?Fall Screen: See Questionaire below. ?  ? ?  06/09/2021  ?  3:58 PM 05/25/2021  ?  1:20 PM 05/22/2020  ?  8:28 AM 05/11/2016  ?  8:19 AM 01/13/2015  ?  8:28 AM  ?Fall Risk   ?Falls in the past year? 0 0 0 No Yes  ?Number falls in past yr: 0 0 0  1  ?Injury with Fall? 0 0 0  Yes  ?Risk for fall due to : No Fall Risks No Fall Risks No Fall Risks  Other (Comment)  ?Follow up Falls evaluation completed Falls evaluation completed Falls evaluation completed  Falls evaluation completed  ? ? ?ADL screen:  See questionnaire below.  ?Functional Status Survey: ?Is the patient deaf or have difficulty hearing?: No ?Does the patient have difficulty seeing, even when wearing glasses/contacts?: No ?Does the patient have difficulty concentrating, remembering, or making decisions?: No ?Does the patient have difficulty walking or climbing stairs?: No ?Does the patient have difficulty dressing or bathing?: No ?Does the patient have difficulty doing errands alone such as visiting a doctor's office  or shopping?: No ? ? ?Review of Systems ? ?Constitutional: -, -unexpected weight change, -anorexia, -fatigue ?Allergy: -sneezing, -itching, -congestion ?Dermatology: denies changing moles, rash, lumps ?ENT: -runny nose, -ear pain, -sore throat,  ?Cardiology:  -chest pain, -palpitations, -orthopnea, ?Respiratory: -cough, -shortness of breath, -dyspnea on exertion, -wheezing,  ?Gastroenterology: -abdominal pain, -nausea, -vomiting, -diarrhea,  -constipation, -dysphagia ?Hematology: -bleeding or bruising problems ?Musculoskeletal: -arthralgias, -myalgias, -joint swelling, -back pain, - ?Ophthalmology: -vision changes,  ?Urology: -dysuria, -difficulty urinating,  -urinary frequency, -urgency, incontinence ?Neurology: -, -numbness, , -memory loss, -falls, -dizziness ? ? ? ?PHYSICAL EXAM: ? ? ? ?General Appearance: Alert, cooperative, no distress, appears stated age ?Psych: Normal mood, affect, hygiene and grooming ? ?ASSESSMENT/PLAN: ?Hyperlipidemia associated with type 2 diabetes mellitus (HCC) ? ?Hypertension associated with diabetes (HCC) ? ?Obesity (BMI 30-39.9) ? ?Gastroesophageal reflux disease, unspecified whether esophagitis present ? ?Diabetes mellitus type 2 with complications (HCC) ? ? ? ?Discussed  at least 30 minutes of aerobic activity at least 5 days/week; healthy diet and alcohol recommendations (less than or equal to 2 drinks/day) reviewed;  Immunization recommendations discussed.  Colonoscopy recommendations reviewed. ? ? ?Medicare Attestation ?I have personally reviewed: ?The patient's medical and social history ?Their use of alcohol, tobacco or illicit drugs ?Their current medications and supplements ?The patient's functional ability including ADLs,fall risks, home safety risks, cognitive, and hearing and visual impairment ?Diet and physical activities ?Evidence for depression or mood disorders ? ?The patient's weight, height, and BMI have been recorded in the chart.  I have made referrals, counseling, and provided education to the patient based on review of the above and I have provided the patient with a written personalized care plan for preventive services.   ? ? ?Sharlot Gowda, MD   06/09/2021  ? ? ?  ?

## 2021-06-10 LAB — CBC WITH DIFFERENTIAL/PLATELET
Basophils Absolute: 0.1 10*3/uL (ref 0.0–0.2)
Basos: 1 %
EOS (ABSOLUTE): 0.2 10*3/uL (ref 0.0–0.4)
Eos: 2 %
Hematocrit: 50.1 % (ref 37.5–51.0)
Hemoglobin: 17 g/dL (ref 13.0–17.7)
Immature Grans (Abs): 0 10*3/uL (ref 0.0–0.1)
Immature Granulocytes: 0 %
Lymphocytes Absolute: 2.1 10*3/uL (ref 0.7–3.1)
Lymphs: 32 %
MCH: 32.4 pg (ref 26.6–33.0)
MCHC: 33.9 g/dL (ref 31.5–35.7)
MCV: 96 fL (ref 79–97)
Monocytes Absolute: 0.4 10*3/uL (ref 0.1–0.9)
Monocytes: 7 %
Neutrophils Absolute: 3.8 10*3/uL (ref 1.4–7.0)
Neutrophils: 58 %
Platelets: 255 10*3/uL (ref 150–450)
RBC: 5.24 x10E6/uL (ref 4.14–5.80)
RDW: 13.1 % (ref 11.6–15.4)
WBC: 6.6 10*3/uL (ref 3.4–10.8)

## 2021-06-10 LAB — COMPREHENSIVE METABOLIC PANEL
ALT: 23 IU/L (ref 0–44)
AST: 18 IU/L (ref 0–40)
Albumin/Globulin Ratio: 1.8 (ref 1.2–2.2)
Albumin: 4.4 g/dL (ref 3.8–4.8)
Alkaline Phosphatase: 102 IU/L (ref 44–121)
BUN/Creatinine Ratio: 19 (ref 10–24)
BUN: 14 mg/dL (ref 8–27)
Bilirubin Total: 1.2 mg/dL (ref 0.0–1.2)
CO2: 22 mmol/L (ref 20–29)
Calcium: 9.4 mg/dL (ref 8.6–10.2)
Chloride: 104 mmol/L (ref 96–106)
Creatinine, Ser: 0.74 mg/dL — ABNORMAL LOW (ref 0.76–1.27)
Globulin, Total: 2.4 g/dL (ref 1.5–4.5)
Glucose: 128 mg/dL — ABNORMAL HIGH (ref 70–99)
Potassium: 4.3 mmol/L (ref 3.5–5.2)
Sodium: 141 mmol/L (ref 134–144)
Total Protein: 6.8 g/dL (ref 6.0–8.5)
eGFR: 100 mL/min/{1.73_m2} (ref >59)

## 2021-07-08 ENCOUNTER — Ambulatory Visit (INDEPENDENT_AMBULATORY_CARE_PROVIDER_SITE_OTHER): Payer: Medicare Other | Admitting: Family Medicine

## 2021-07-08 ENCOUNTER — Encounter: Payer: Self-pay | Admitting: Family Medicine

## 2021-07-08 VITALS — BP 130/86 | HR 85 | Temp 97.7°F | Wt 200.4 lb

## 2021-07-08 DIAGNOSIS — L309 Dermatitis, unspecified: Secondary | ICD-10-CM

## 2021-07-08 MED ORDER — TRIAMCINOLONE ACETONIDE 0.5 % EX OINT: 1.0000 "application " | TOPICAL_OINTMENT | Freq: Two times a day (BID) | CUTANEOUS | 0 refills | Status: AC

## 2021-07-08 NOTE — Progress Notes (Signed)
   Subjective:    Patient ID: Paul Long, male    DOB: 08/01/1955, 66 y.o.   MRN: [id number]  HPI He is here for evaluation of a rash that he thinks started after he was placed on Jardiance.  The rash is mainly in the axillary area but also notes that on his neck area but none in the inguinal area or on his abdomen or lower extremities..   Review of Systems     Objective:   Physical Exam Alert and in no distress.  The axillary area bilaterally is erythematous with some linear erythema from scratching.  The rest of skin exam shows no particular lesions.       Assessment & Plan:  Dermatitis - Plan: triamcinolone ointment (KENALOG) 0.5 % I explained that this could be related to the Jardiance and go ahead and stop it for a week and see if the symptoms go away.  If they do discussed the possibility of placing him back on it to see if it reoccurs as it could be a true true but unrelated issue.  He was comfortable with that approach.

## 2021-07-08 NOTE — Patient Instructions (Signed)
Stop the Jardiance for about a week.  Keep using the cortisone and use either purpose soap  or basis soap I will call in a steroid cream that you can use and you can also use Benadryl at night to help with the itch.

## 2021-07-20 DIAGNOSIS — H52203 Unspecified astigmatism, bilateral: Secondary | ICD-10-CM | POA: Diagnosis not present

## 2021-07-20 LAB — HM DIABETES EYE EXAM

## 2021-07-24 ENCOUNTER — Encounter: Payer: Self-pay | Admitting: Family Medicine

## 2021-08-03 DIAGNOSIS — H524 Presbyopia: Secondary | ICD-10-CM | POA: Diagnosis not present

## 2021-08-21 ENCOUNTER — Encounter: Payer: Self-pay | Admitting: Family Medicine

## 2021-09-30 ENCOUNTER — Ambulatory Visit: Payer: Self-pay

## 2021-09-30 NOTE — Patient Instructions (Signed)
Visit Information  Thank you for taking time to visit with me today. Please don't hesitate to contact me if I can be of assistance to you.   Following are the goals we discussed today:   Goals Addressed             This Visit's Progress    COMPLETED: Care Coordination Activities       Care Coordination Interventions: SDoH screening performed - no acute resource needs identified Performed chart review to note patient participated in annual wellness visit 06/09/21 Discussed patient has an upcoming appointment with primary care provider to assess management of diabetes Noted most recent A1C of 6.7 Educated on role of care coordination team advising the patient we can assist as needed - no follow up needed at this time Encouraged the patient to contact his primary care provider as needed        Please call the care guide team at [phone] if you need to schedule an appointment with me  If you are experiencing a Mental Health or Behavioral Health Crisis or need someone to talk to, please call 1-800-273-TALK (toll free, 24 hour hotline)  Patient verbalizes understanding of instructions and care plan provided today and agrees to view in MyChart. Active MyChart status and patient understanding of how to access instructions and care plan via MyChart confirmed with patient.     No further follow up required: Please contact your primary care provider as needed.  [name], BSW, CDP Social Worker, Certified Dementia Practitioner Care Coordination [phone]

## 2021-09-30 NOTE — Patient Outreach (Signed)
  Care Coordination   Initial Visit Note   09/30/2021 Name: Paul Long MRN: [id number] DOB: Jul 10, 1955  Paul Long is a 66 y.o. year old male who sees Paul Long, Paul All, MD for primary care. I spoke with  Paul Long by phone today  What matters to the patients health and wellness today?  To continue managing diabetes    Goals Addressed             This Visit's Progress    COMPLETED: Care Coordination Activities       Care Coordination Interventions: SDoH screening performed - no acute resource needs identified Performed chart review to note patient participated in annual wellness visit 06/09/21 Discussed patient has an upcoming appointment with primary care provider to assess management of diabetes Noted most recent A1C of 6.7 Educated on role of care coordination team advising the patient we can assist as needed - no follow up needed at this time Encouraged the patient to contact his primary care provider as needed        SDOH assessments and interventions completed:  Yes  SDOH Interventions Today    Flowsheet Row Most Recent Value  SDOH Interventions   Food Insecurity Interventions Intervention Not Indicated  Housing Interventions Intervention Not Indicated  Transportation Interventions Intervention Not Indicated        Care Coordination Interventions Activated:  Yes  Care Coordination Interventions:  Yes, provided   Follow up plan: No further intervention required.   Encounter Outcome:  Pt. Visit Completed   [name], BSW, CDP Social Worker, Certified Dementia Practitioner Care Coordination [phone]

## 2021-10-05 ENCOUNTER — Ambulatory Visit (INDEPENDENT_AMBULATORY_CARE_PROVIDER_SITE_OTHER): Payer: Medicare Other | Admitting: Family Medicine

## 2021-10-05 VITALS — BP 122/80 | HR 90 | Temp 98.6°F | Wt 201.4 lb

## 2021-10-05 DIAGNOSIS — E1169 Type 2 diabetes mellitus with other specified complication: Secondary | ICD-10-CM

## 2021-10-05 DIAGNOSIS — M1000 Idiopathic gout, unspecified site: Secondary | ICD-10-CM

## 2021-10-05 DIAGNOSIS — E118 Type 2 diabetes mellitus with unspecified complications: Secondary | ICD-10-CM | POA: Diagnosis not present

## 2021-10-05 DIAGNOSIS — J309 Allergic rhinitis, unspecified: Secondary | ICD-10-CM

## 2021-10-05 DIAGNOSIS — L570 Actinic keratosis: Secondary | ICD-10-CM

## 2021-10-05 DIAGNOSIS — Z23 Encounter for immunization: Secondary | ICD-10-CM

## 2021-10-05 DIAGNOSIS — E669 Obesity, unspecified: Secondary | ICD-10-CM

## 2021-10-05 DIAGNOSIS — E785 Hyperlipidemia, unspecified: Secondary | ICD-10-CM

## 2021-10-05 DIAGNOSIS — K219 Gastro-esophageal reflux disease without esophagitis: Secondary | ICD-10-CM | POA: Diagnosis not present

## 2021-10-05 DIAGNOSIS — E1159 Type 2 diabetes mellitus with other circulatory complications: Secondary | ICD-10-CM

## 2021-10-05 DIAGNOSIS — I152 Hypertension secondary to endocrine disorders: Secondary | ICD-10-CM | POA: Diagnosis not present

## 2021-10-05 LAB — POCT GLYCOSYLATED HEMOGLOBIN (HGB A1C)

## 2021-10-05 NOTE — Progress Notes (Signed)
Subjective:    Patient ID: Paul Long, male    DOB: [birth year], 66 y.o.   MRN: [id number]  Paul Long is a 66 y.o. male who presents for follow-up of Type 2 diabetes mellitus.  Home blood sugar records: fasting range: 150-180 Current symptoms/problems include none and have been stable. Daily foot checks:daily   Any foot concerns: no Exercise: Home exercise routine includes walking 0.5 hrs per day. Diet: Regular He continues on allopurinol for his gout.  He is also taking atorvastatin without difficulty.  Continues on Trulicity, Jardiance and Actos plus and having no difficulty with that.  His allergies are causing no trouble.  He did see his dentist recently who made the comment that the oropharyngeal area might be conducive for him to be having sleep apnea .  He is having no difficulty with his gout.  He has not retired and is starting to date several people and enjoying his new lifestyle. The following portions of the patient's history were reviewed and updated as appropriate: allergies, current medications, past medical history, past social history and problem list.  ROS as in subjective above.     Objective:    Physical Exam Alert and in no distress exam of the left cheek does show slightly erythematous dry lesion Epworth sleepiness scale of 2 Hemoglobin A1c 6.7 Lab Review    Latest Ref Rng & Units 06/09/2021   11:14 AM 06/02/2021    2:30 PM 06/02/2021    2:28 PM 06/02/2021    9:27 AM 01/26/2021   10:59 AM  Diabetic Labs  HbA1c 4.0 - 5.6 %  6.7    6.7   Microalbumin mg/L   5.4   6.8   Micro/Creat Ratio    7.9   7.8   Chol 100 - 199 mg/dL    245    HDL >80 mg/dL    45    Calc LDL 0 - 99 mg/dL    51    Triglycerides 0 - 149 mg/dL    998    Creatinine 3.38 - 1.27 mg/dL 2.50    5.39        7/67/3419    9:18 AM 07/08/2021    3:42 PM 06/09/2021    4:03 PM 06/02/2021    8:25 AM 05/25/2021    1:16 PM  BP/Weight  Systolic BP 122 130 118 100 118  Diastolic BP 80 86 72  68 72  Wt. (Lbs) 201.4 200.4 197 197.8 198.8  BMI 29.74 kg/m2 29.59 kg/m2 29.09 kg/m2 29.21 kg/m2 29.36 kg/m2      Latest Ref Rng & Units 07/20/2021   12:00 AM 06/02/2021    8:30 AM  Foot/eye exam completion dates  Eye Exam No Retinopathy No Retinopathy       Foot Form Completion   Done     This result is from an external source.    Arkin  reports that he has never smoked. He has never used smokeless tobacco. He reports that he does not drink alcohol and does not use drugs.     Assessment & Plan:    Hypertension associated with diabetes (HCC)  Allergic rhinitis, unspecified seasonality, unspecified trigger  Gastroesophageal reflux disease without esophagitis  Diabetes mellitus type 2 with complications (HCC)  Hyperlipidemia associated with type 2 diabetes mellitus (HCC)  Idiopathic gout, unspecified chronicity, unspecified site  Obesity (BMI 30-39.9) - Plan: Home sleep test  AK (actinic keratosis) - Plan: Ambulatory referral to Dermatology  Need for influenza vaccination -  Plan: Flu Vaccine QUAD 6+ mos PF IM (Fluarix Quad PF)  Rx changes: none Education: Reviewed 'ABCs' of diabetes management (respective goals in parentheses):  A1C (<7), blood pressure (<130/80), and cholesterol (LDL <100). Compliance at present is estimated to be good. Efforts to improve compliance (if necessary) will be directed at increased exercise. Follow up: 6 months  The lesion appears to be actinic keratosis and I will therefore refer him to dermatology for more definitive care.

## 2021-10-06 ENCOUNTER — Telehealth: Payer: Self-pay | Admitting: Family Medicine

## 2021-10-06 NOTE — Telephone Encounter (Signed)
Pt called for refills of Actoplus met. Please send to [facility]. Pt was seen yesterday.

## 2021-10-07 ENCOUNTER — Other Ambulatory Visit: Payer: Self-pay

## 2021-10-07 DIAGNOSIS — E118 Type 2 diabetes mellitus with unspecified complications: Secondary | ICD-10-CM

## 2021-10-07 MED ORDER — PIOGLITAZONE HCL-METFORMIN HCL 15-850 MG PO TABS: 1.0000 | ORAL_TABLET | Freq: Two times a day (BID) | ORAL | 1 refills | Status: DC

## 2021-10-07 NOTE — Telephone Encounter (Signed)
Done KH 

## 2021-11-12 DIAGNOSIS — X32XXXD Exposure to sunlight, subsequent encounter: Secondary | ICD-10-CM | POA: Diagnosis not present

## 2021-11-12 DIAGNOSIS — L570 Actinic keratosis: Secondary | ICD-10-CM | POA: Diagnosis not present

## 2021-11-18 ENCOUNTER — Encounter: Payer: Self-pay | Admitting: Family Medicine

## 2021-11-18 ENCOUNTER — Ambulatory Visit (INDEPENDENT_AMBULATORY_CARE_PROVIDER_SITE_OTHER): Payer: Medicare Other | Admitting: Family Medicine

## 2021-11-18 VITALS — BP 100/60 | HR 100 | Temp 98.1°F | Wt 202.2 lb

## 2021-11-18 DIAGNOSIS — N529 Male erectile dysfunction, unspecified: Secondary | ICD-10-CM

## 2021-11-18 MED ORDER — TADALAFIL 20 MG PO TABS: 20.0000 mg | ORAL_TABLET | Freq: Every day | ORAL | 5 refills | Status: DC | PRN

## 2021-11-18 NOTE — Progress Notes (Signed)
   Subjective:    Patient ID: Paul Long, male    DOB: 1955/06/19, 66 y.o.   MRN: [id number]  HPI He is now in a new relationship.  He is starting to become more sexually active and apparently has had difficulty with getting and maintaining an erection.  Would like to be placed on medication.   Review of Systems     Objective:   Physical Exam Alert and in no distress otherwise not examined       Assessment & Plan:  Erectile dysfunction, unspecified erectile dysfunction type - Plan: tadalafil (CIALIS) 20 MG tablet Discussed the use of Cialis, possible side effects and efficacy.  He is to try the medication at least an hour ahead of time and I explained that he might not need it every time he wants to have sex.  He will keep me informed as to whether he needs to try different medication down the road.

## 2021-11-19 DIAGNOSIS — H2513 Age-related nuclear cataract, bilateral: Secondary | ICD-10-CM | POA: Diagnosis not present

## 2021-11-19 DIAGNOSIS — H43811 Vitreous degeneration, right eye: Secondary | ICD-10-CM | POA: Diagnosis not present

## 2021-11-24 ENCOUNTER — Encounter: Payer: Self-pay | Admitting: Internal Medicine

## 2021-11-25 ENCOUNTER — Encounter: Payer: Self-pay | Admitting: Family Medicine

## 2021-11-26 ENCOUNTER — Other Ambulatory Visit: Payer: Self-pay

## 2021-11-26 DIAGNOSIS — E669 Obesity, unspecified: Secondary | ICD-10-CM

## 2021-11-26 NOTE — Telephone Encounter (Signed)
Lvm for pt to call back. KH 

## 2021-11-27 NOTE — Telephone Encounter (Signed)
Advised pt of new order and to look out for a call from Paul Long. [place]

## 2021-12-07 ENCOUNTER — Encounter: Payer: Self-pay | Admitting: Internal Medicine

## 2021-12-09 ENCOUNTER — Other Ambulatory Visit (INDEPENDENT_AMBULATORY_CARE_PROVIDER_SITE_OTHER): Payer: Medicare Other

## 2021-12-09 DIAGNOSIS — Z23 Encounter for immunization: Secondary | ICD-10-CM | POA: Diagnosis not present

## 2022-01-11 ENCOUNTER — Ambulatory Visit (HOSPITAL_BASED_OUTPATIENT_CLINIC_OR_DEPARTMENT_OTHER): Payer: BLUE CROSS/BLUE SHIELD | Admitting: Internal Medicine

## 2022-01-11 DIAGNOSIS — E669 Obesity, unspecified: Secondary | ICD-10-CM

## 2022-01-13 ENCOUNTER — Ambulatory Visit (HOSPITAL_BASED_OUTPATIENT_CLINIC_OR_DEPARTMENT_OTHER): Payer: Medicare Other | Attending: Family Medicine | Admitting: Internal Medicine

## 2022-01-13 VITALS — Ht 71.0 in | Wt 200.0 lb

## 2022-01-13 DIAGNOSIS — R0683 Snoring: Secondary | ICD-10-CM | POA: Insufficient documentation

## 2022-01-13 DIAGNOSIS — G4700 Insomnia, unspecified: Secondary | ICD-10-CM | POA: Diagnosis not present

## 2022-01-13 DIAGNOSIS — E669 Obesity, unspecified: Secondary | ICD-10-CM | POA: Diagnosis not present

## 2022-01-13 DIAGNOSIS — G4733 Obstructive sleep apnea (adult) (pediatric): Secondary | ICD-10-CM | POA: Diagnosis not present

## 2022-01-13 DIAGNOSIS — Z6830 Body mass index (BMI) 30.0-30.9, adult: Secondary | ICD-10-CM | POA: Diagnosis not present

## 2022-01-16 DIAGNOSIS — E669 Obesity, unspecified: Secondary | ICD-10-CM | POA: Diagnosis not present

## 2022-01-16 NOTE — Procedures (Signed)
    Patient Name: Paul Long, Paul Long Date: 01/13/2022 Gender: Male D.O.B: 1955-07-16 Age (years): 66 Referring Provider: Ronnald Nian Height (inches): 71 Interpreting Physician: Jetty Duhamel MD, ABSM Weight (lbs): 202 RPSGT: Randlett Sink BMI: 28 MRN: [id number] Neck Size: 17.00  CLINICAL INFORMATION Sleep Study Type: HST Indication for sleep study: Insomnia with OSA Epworth Sleepiness Score: 6  SLEEP STUDY TECHNIQUE A multi-channel overnight portable sleep study was performed. The channels recorded were: nasal airflow, thoracic respiratory movement, and oxygen saturation with a pulse oximetry. Snoring was also monitored.  MEDICATIONS Patient self administered medications include: none reported.  SLEEP ARCHITECTURE Patient was studied for 369.5 minutes. The sleep efficiency was 100.0 % and the patient was supine for 0%. The arousal index was 0.0 per hour.  RESPIRATORY PARAMETERS The overall AHI was 45.1 per hour, with a central apnea index of 0 per hour. The oxygen nadir was 86% during sleep.  CARDIAC DATA Mean heart rate during sleep was 76.8 bpm.  IMPRESSIONS - Severe obstructive sleep apnea occurred during this study (AHI = 45.1/h). - Moderate oxygen desaturation was noted during this study (Min O2 = 86%). Mean 93% - Patient snored.  DIAGNOSIS - Obstructive Sleep Apnea (G47.33)  RECOMMENDATIONS - Suggest CPAP titration sleep study or autopap. Other options would be based on clinical judgment. - Be careful with alcohol, sedatives and other CNS depressants that may worsen sleep apnea and disrupt normal sleep architecture. - Sleep hygiene should be reviewed to assess factors that may improve sleep quality. - Weight management and regular exercise should be initiated or continued.  [Electronically signed] 01/16/2022 12:10 PM  Jetty Duhamel MD, ABSM Diplomate, American Board of Sleep Medicine NPI: [id number]                          Jetty Duhamel Diplomate, American Board of Sleep Medicine  ELECTRONICALLY SIGNED ON:  01/16/2022, 12:06 PM [facility] PH: [phone]   FX: [phone] ACCREDITED BY THE AMERICAN ACADEMY OF SLEEP MEDICINE

## 2022-01-19 ENCOUNTER — Other Ambulatory Visit: Payer: Self-pay | Admitting: Family Medicine

## 2022-01-19 DIAGNOSIS — E118 Type 2 diabetes mellitus with unspecified complications: Secondary | ICD-10-CM

## 2022-02-01 ENCOUNTER — Telehealth: Payer: Self-pay | Admitting: Family Medicine

## 2022-02-01 NOTE — Progress Notes (Signed)
   Subjective:    Patient ID: Paul Long, male    DOB: December 23, 1955, 66 y.o.   MRN: [id number]  HPI    Review of Systems     Objective:   Physical Exam        Assessment & Plan:   0000

## 2022-02-01 NOTE — Telephone Encounter (Signed)
Pt left message he has never received his Sleep Study results.  Please advise pt [phone]

## 2022-02-02 NOTE — Telephone Encounter (Signed)
Tried calling patient. No answer. Phone just kept ringing. Will try calling back later.

## 2022-02-04 NOTE — Telephone Encounter (Signed)
Tried calling patient at number listed in initial message. A gentleman answered the phone stating, I had the wrong number. I tried calling patient at the number listed in chart [phone]. Received and automated message stating the number has been changed/ or is no longer in service. Will mail a letter out to patient tomorrow.

## 2022-02-05 NOTE — Telephone Encounter (Signed)
Called and spoke to patient and he has a scheduled virtual on Tuesday to discuss sleep study

## 2022-02-09 ENCOUNTER — Other Ambulatory Visit: Payer: Self-pay | Admitting: Family Medicine

## 2022-02-09 ENCOUNTER — Encounter: Payer: Self-pay | Admitting: Family Medicine

## 2022-02-09 ENCOUNTER — Telehealth (INDEPENDENT_AMBULATORY_CARE_PROVIDER_SITE_OTHER): Payer: Medicare Other | Admitting: Family Medicine

## 2022-02-09 DIAGNOSIS — G4733 Obstructive sleep apnea (adult) (pediatric): Secondary | ICD-10-CM | POA: Diagnosis not present

## 2022-02-09 DIAGNOSIS — E118 Type 2 diabetes mellitus with unspecified complications: Secondary | ICD-10-CM

## 2022-02-09 HISTORY — DX: Obstructive sleep apnea (adult) (pediatric): G47.33

## 2022-02-09 NOTE — Progress Notes (Signed)
   Subjective:    Patient ID: Paul Long, male    DOB: 1955-12-05, 66 y.o.   MRN: [id number]  HPI Documentation for virtual audio and video telecommunications through Caregility encounter: The patient was located at home. 2 patient identifiers used.  The provider was located in the office. The patient did consent to this visit and is aware of possible charges through their insurance for this visit. he other persons participating in this telemedicine service were none. Time spent on call was 5 minutes and in review of previous records >20 minutes total for counseling and coordination of care. This virtual service is not related to other E/M service within previous 7 days.  He recently had his sleep study done which did show severe OSA with an AHI of 45.1.  He also had evidence of some slight desaturation but overall fairly decent pulse ox.  Review of Systems     Objective:   Physical Exam Alert and in no distress otherwise not examined       Assessment & Plan:  Obstructive sleep apnea (adult) (pediatric) - Plan: For home use only DME continuous positive airway pressure (CPAP) I discussed the diagnosis of OSA with, and the need for CPAP.  I will set him up on auto titrate 5-15.  Explained that they will work with him to get him on a mask that fits him properly.  He does not smoke and is on no sedatives.  Also discussed sleep hygiene with him.  Once he gets a mask in the CPAP set up, I will then need a readout and also to evaluate him for benefit from this.  He expressed understanding of all this.

## 2022-02-16 ENCOUNTER — Encounter: Payer: Self-pay | Admitting: Family Medicine

## 2022-02-16 NOTE — Telephone Encounter (Signed)
Community message sent to Kandis Cocking requesting an update. I called patient and advised him that I am waiting to hear back from [facility] with an update.

## 2022-03-03 DIAGNOSIS — G4733 Obstructive sleep apnea (adult) (pediatric): Secondary | ICD-10-CM | POA: Diagnosis not present

## 2022-03-18 DIAGNOSIS — G4733 Obstructive sleep apnea (adult) (pediatric): Secondary | ICD-10-CM | POA: Diagnosis not present

## 2022-04-13 ENCOUNTER — Ambulatory Visit (INDEPENDENT_AMBULATORY_CARE_PROVIDER_SITE_OTHER): Payer: Medicare Other | Admitting: Family Medicine

## 2022-04-13 ENCOUNTER — Encounter: Payer: Self-pay | Admitting: Family Medicine

## 2022-04-13 VITALS — BP 114/86 | HR 82 | Temp 98.2°F | Resp 16 | Wt 214.0 lb

## 2022-04-13 DIAGNOSIS — G4733 Obstructive sleep apnea (adult) (pediatric): Secondary | ICD-10-CM | POA: Diagnosis not present

## 2022-04-13 DIAGNOSIS — E1159 Type 2 diabetes mellitus with other circulatory complications: Secondary | ICD-10-CM

## 2022-04-13 DIAGNOSIS — E785 Hyperlipidemia, unspecified: Secondary | ICD-10-CM

## 2022-04-13 DIAGNOSIS — K219 Gastro-esophageal reflux disease without esophagitis: Secondary | ICD-10-CM

## 2022-04-13 DIAGNOSIS — E669 Obesity, unspecified: Secondary | ICD-10-CM

## 2022-04-13 DIAGNOSIS — E118 Type 2 diabetes mellitus with unspecified complications: Secondary | ICD-10-CM

## 2022-04-13 DIAGNOSIS — I152 Hypertension secondary to endocrine disorders: Secondary | ICD-10-CM

## 2022-04-13 DIAGNOSIS — J309 Allergic rhinitis, unspecified: Secondary | ICD-10-CM | POA: Diagnosis not present

## 2022-04-13 DIAGNOSIS — E1169 Type 2 diabetes mellitus with other specified complication: Secondary | ICD-10-CM

## 2022-04-13 LAB — POCT GLYCOSYLATED HEMOGLOBIN (HGB A1C): Hemoglobin A1C: 7.8 % — AB (ref 4.0–5.6)

## 2022-04-13 MED ORDER — EMPAGLIFLOZIN 25 MG PO TABS: 25.0000 mg | ORAL_TABLET | Freq: Every day | ORAL | 1 refills | Status: DC

## 2022-04-13 NOTE — Progress Notes (Addendum)
Rushina did you get the readout on weight Subjective:    Patient ID: Paul Long, male    DOB: 22-Oct-1955, 67 y.o.   MRN: [id number]  Paul Long is a 67 y.o. male who presents for follow-up of Type 2 diabetes mellitus.  Home blood sugar records:  130-170 Current symptoms/problems include none and have been stable. Daily foot checks: yes   Any foot concerns: none How often blood sugars checked: every morning and afternoon Exercise: Home exercise routine includes walking   hrs per 3 times a day. Diet: chicken, salads, once and a while a burger.  He admits to dietary indiscretion especially over the holidays and exercising is also fallen off a little bit.  He is using his CPAP and has noted an increase in his energy and no trouble with daytime sleepiness.  He is very happy with the results.  He states that he does get feedback from [employer] and apparently the numbers look good. The following portions of the patient's history were reviewed and updated as appropriate: allergies, current medications, past medical history, past social history and problem list.  ROS as in subjective above.     Objective:    Physical Exam Alert and in no distress otherwise not examined. Hemoglobin A1c is 7.8. Blood pressure 114/86, pulse 82, temperature 98.2 F (36.8 C), temperature source Oral, resp. rate 16, weight 214 lb (97.1 kg), SpO2 95 %.  Lab Review    Latest Ref Rng & Units 04/13/2022    9:41 AM 06/09/2021   11:14 AM 06/02/2021    2:30 PM 06/02/2021    2:28 PM 06/02/2021    9:27 AM  Diabetic Labs  HbA1c 4.0 - 5.6 % 7.8   6.7     Microalbumin mg/L    5.4    Micro/Creat Ratio     7.9    Chol 100 - 199 mg/dL     118   HDL >39 mg/dL     45   Calc LDL 0 - 99 mg/dL     51   Triglycerides 0 - 149 mg/dL     127   Creatinine 0.76 - 1.27 mg/dL  0.74    0.76       04/13/2022    9:34 AM 01/13/2022    2:45 PM 01/11/2022   10:00 AM 11/18/2021   10:15 AM 10/05/2021    9:18 AM  BP/Weight   Systolic BP 99991111   123XX123 123XX123  Diastolic BP 86   60 80  Wt. (Lbs) 214 200 200 202.2 201.4  BMI 29.85 kg/m2 27.89 kg/m2 27.89 kg/m2 29.86 kg/m2 29.74 kg/m2      Latest Ref Rng & Units 07/20/2021   12:00 AM 06/02/2021    8:30 AM  Foot/eye exam completion dates  Eye Exam No Retinopathy No Retinopathy       Foot Form Completion   Done     This result is from an external source.    Paul Long  reports that he has never smoked. He has never used smokeless tobacco. He reports that he does not drink alcohol and does not use drugs.     Assessment & Plan:    Diabetes mellitus type 2 with complications ([place]) - Plan: POCT glycosylated hemoglobin (Hb A1C)  Hypertension associated with diabetes (HCC)  Allergic rhinitis, unspecified seasonality, unspecified trigger  Obstructive sleep apnea (adult) (pediatric)  Hyperlipidemia associated with type 2 diabetes mellitus ([place])  Gastroesophageal reflux disease without esophagitis  Obesity (BMI 30-39.9)  Rx changes:  Jardiance increased to 25 mg Education: Reviewed 'ABCs' of diabetes management (respective goals in parentheses):  A1C (<7), blood pressure (<130/80), and cholesterol (LDL <100). Compliance at present is estimated to be poor. Efforts to improve compliance (if necessary) will be directed at increased exercise.  Also discussed cutting back on carbohydrates. Follow up: 4 months Continue on CPAP and I will download the CPAP results when it comes in.  3/13 CPAP readout shows that he meets criteria for compliance and he also states that he has noted a definite improvement in his energy and stamina.

## 2022-04-16 DIAGNOSIS — G4733 Obstructive sleep apnea (adult) (pediatric): Secondary | ICD-10-CM | POA: Diagnosis not present

## 2022-04-22 ENCOUNTER — Telehealth: Payer: Self-pay | Admitting: Family Medicine

## 2022-04-22 NOTE — Telephone Encounter (Signed)
Called Kandis Cocking at [facility] [phone] t/w Caren Griffins, she gave me his email. Sent email req help with denial of Humidifier Heat for cpap Also Caren Griffins will send to [employer] to review.  Await review.   CPAP was approved                  Humidifier denied.

## 2022-04-28 ENCOUNTER — Telehealth: Payer: Self-pay

## 2022-04-28 NOTE — Telephone Encounter (Signed)
Patient reports that he is benefiting from using his CPAP as he feels much better. He states that he is not as tired in the mornings when he wakes up. He reports good compliance with CPAP usage and good tolerance.

## 2022-04-28 NOTE — Telephone Encounter (Signed)
Please addend office visit note from 04/13/2022 with a statement of compliance and benefit of CPAP usage. We will need the office note to include this before faxing to [employer].

## 2022-04-29 NOTE — Telephone Encounter (Signed)
Office notes faxed 

## 2022-05-11 ENCOUNTER — Other Ambulatory Visit: Payer: Self-pay

## 2022-05-11 ENCOUNTER — Telehealth: Payer: Self-pay | Admitting: Family Medicine

## 2022-05-11 MED ORDER — FREESTYLE LITE TEST VI STRP: ORAL_STRIP | 3 refills | Status: DC

## 2022-05-11 NOTE — Telephone Encounter (Signed)
Paul Long requesting a refill on his  glucose blood (FREESTYLE LITE) test strip to  [facility], [place]

## 2022-05-17 DIAGNOSIS — G4733 Obstructive sleep apnea (adult) (pediatric): Secondary | ICD-10-CM | POA: Diagnosis not present

## 2022-05-18 ENCOUNTER — Telehealth: Payer: Self-pay | Admitting: Family Medicine

## 2022-05-18 DIAGNOSIS — E118 Type 2 diabetes mellitus with unspecified complications: Secondary | ICD-10-CM

## 2022-05-18 MED ORDER — ONETOUCH ULTRA 2 W/DEVICE KIT: 1.0000 | PACK | 0 refills | Status: AC

## 2022-05-18 MED ORDER — TRULICITY 1.5 MG/0.5ML ~~LOC~~ SOAJ: SUBCUTANEOUS | 1 refills | Status: DC

## 2022-05-18 MED ORDER — ONETOUCH ULTRA VI STRP: ORAL_STRIP | 12 refills | Status: AC

## 2022-05-18 MED ORDER — ONETOUCH ULTRASOFT LANCETS MISC: 12 refills | Status: AC

## 2022-05-18 NOTE — Telephone Encounter (Signed)
Prescriptions sent into pharmacy. Patient wants to know if he needs to come in before 6/25 vist for uric acid?

## 2022-05-18 NOTE — Telephone Encounter (Signed)
Freestyle is not on covered list, A 90 day temporary supply was sent to patient. Please change to [employer] or One Touch strips and meter Pt need Trulicity refill Does pt need to come in before 6/25 vist for uric acid?

## 2022-05-19 NOTE — Telephone Encounter (Signed)
Patient advised.

## 2022-05-25 ENCOUNTER — Other Ambulatory Visit: Payer: Self-pay | Admitting: Family Medicine

## 2022-05-25 ENCOUNTER — Telehealth: Payer: Self-pay | Admitting: Family Medicine

## 2022-05-25 DIAGNOSIS — E118 Type 2 diabetes mellitus with unspecified complications: Secondary | ICD-10-CM

## 2022-05-25 MED ORDER — TRULICITY 1.5 MG/0.5ML ~~LOC~~ SOAJ: SUBCUTANEOUS | 1 refills | Status: DC

## 2022-05-25 NOTE — Telephone Encounter (Signed)
Paul Long states his pharmacy states Trulicity on back order.  We do not have any sample.  Pt states [facility] [address] [place] has Trulicity.  Will send there.

## 2022-06-02 ENCOUNTER — Other Ambulatory Visit: Payer: Self-pay | Admitting: Family Medicine

## 2022-06-02 DIAGNOSIS — M1000 Idiopathic gout, unspecified site: Secondary | ICD-10-CM

## 2022-06-04 DIAGNOSIS — G4733 Obstructive sleep apnea (adult) (pediatric): Secondary | ICD-10-CM | POA: Diagnosis not present

## 2022-06-09 ENCOUNTER — Telehealth: Payer: Self-pay | Admitting: Family Medicine

## 2022-06-09 NOTE — Telephone Encounter (Signed)
Contacted Paul Long to schedule their annual wellness visit. Appointment made for 06/15/22.  [name] AWV direct phone # [phone]

## 2022-07-06 ENCOUNTER — Ambulatory Visit (INDEPENDENT_AMBULATORY_CARE_PROVIDER_SITE_OTHER): Payer: Medicare Other

## 2022-07-06 VITALS — Ht 71.0 in | Wt 210.0 lb

## 2022-07-06 DIAGNOSIS — Z0000 Encounter for general adult medical examination without abnormal findings: Secondary | ICD-10-CM | POA: Diagnosis not present

## 2022-07-06 NOTE — Progress Notes (Signed)
I connected with  Bobbie Chai Willingham on 07/06/22 by a audio enabled telemedicine application and verified that I am speaking with the correct person using two identifiers.  Patient Location: Home  Provider Location: Office/Clinic  I discussed the limitations of evaluation and management by telemedicine. The patient expressed understanding and agreed to proceed.  Subjective:   Paul Long is a 67 y.o. male who presents for Medicare Annual/Subsequent preventive examination.  Review of Systems     Cardiac Risk Factors include: advanced age (>34men, >4 women);diabetes mellitus;dyslipidemia;hypertension;male gender     Objective:    Today's Vitals   07/06/22 1131  Weight: 210 lb (95.3 kg)  Height: 5\' 11"  (1.803 m)   Body mass index is 29.29 kg/m.     07/06/2022   11:35 AM 06/09/2021    3:57 PM 05/22/2020    8:28 AM 11/28/2015    8:01 AM  Advanced Directives  Does Patient Have a Medical Advance Directive? Yes Yes Yes Yes  Type of Estate agent of [place];Living will Living will Living will Living will;Healthcare Power of Attorney  Does patient want to make changes to medical advance directive?  No - Patient declined    Copy of Healthcare Power of Attorney in Chart? No - copy requested       Current Medications (verified) Outpatient Encounter Medications as of 07/06/2022  Medication Sig   allopurinol (ZYLOPRIM) 300 MG tablet TAKE 1 TABLET BY MOUTH DAILY   atorvastatin (LIPITOR) 20 MG tablet Take 1 tablet (20 mg total) by mouth daily.   Blood Glucose Monitoring Suppl (ONE TOUCH ULTRA 2) w/Device KIT 1 kit by Does not apply route as directed.   Coenzyme Q10 (CO Q 10 PO) Take by mouth.   Dulaglutide (TRULICITY) 1.5 MG/0.5ML SOPN INJECT 1.5 MG (0.5ML) UNDER THE SKIN ONCE A WEEK   empagliflozin (JARDIANCE) 25 MG TABS tablet Take 1 tablet (25 mg total) by mouth daily before breakfast.   fish oil-omega-3 fatty acids 1000 MG capsule Take 2 g by mouth daily.    fluticasone (FLONASE) 50 MCG/ACT nasal spray SPRAY 2 SPRAYS INTO EACH NOSTRIL EVERY DAY   glucose blood (ONETOUCH ULTRA) test strip Use as instructed   Lancets (FREESTYLE) lancets USE AS DIRECTED   Lancets (ONETOUCH ULTRASOFT) lancets Use as instructed   lisinopril (ZESTRIL) 5 MG tablet Take 1 tablet (5 mg total) by mouth daily.   MAGNESIUM PO Take by mouth.   Multiple Vitamins-Minerals (MULTIVITAMIN WITH MINERALS) tablet Take 1 tablet by mouth daily.   pioglitazone-metformin (ACTOPLUS MET) 15-850 MG tablet Take 1 tablet by mouth 2 (two) times daily.   tadalafil (CIALIS) 20 MG tablet Take 1 tablet (20 mg total) by mouth daily as needed for erectile dysfunction.   triamcinolone ointment (KENALOG) 0.5 % Apply 1 application. topically 2 (two) times daily. (Patient not taking: Reported on 07/06/2022)   Facility-Administered Encounter Medications as of 07/06/2022  Medication   0.9 %  sodium chloride infusion    Allergies (verified) Patient has no known allergies.   History: Past Medical History:  Diagnosis Date   Allergy    RHINITIS   Diabetes mellitus    Dyslipidemia    Gout    Hyperlipidemia    Hypertension    Obesity    Obstructive sleep apnea (adult) (pediatric) 02/09/2022   Past Surgical History:  Procedure Laterality Date   CHOLECYSTECTOMY  1986   EYE SURGERY  11/2009   CATARACTS (RIGHT)   EYE SURGERY  11/2010   INGUINAL  HERNIA REPAIR Left 1986   INGUINAL HERNIA REPAIR Right 1988   TONSILLECTOMY  1969   Family History  Problem Relation Age of Onset   Hypertension Mother    Arthritis Mother    Hypertension Father    Stroke Father    Colon cancer Neg Hx    Social History   Socioeconomic History   Marital status: Widowed    Spouse name: Not on file   Number of children: Not on file   Years of education: Not on file   Highest education level: Not on file  Occupational History   Not on file  Tobacco Use   Smoking status: Never   Smokeless tobacco: Never   Vaping Use   Vaping Use: Never used  Substance and Sexual Activity   Alcohol use: No   Drug use: No   Sexual activity: Not Currently  Other Topics Concern   Not on file  Social History Narrative   Not on file   Social Determinants of Health   Financial Resource Strain: Low Risk  (07/06/2022)   Overall Financial Resource Strain (CARDIA)    Difficulty of Paying Living Expenses: Not hard at all  Food Insecurity: No Food Insecurity (07/06/2022)   Hunger Vital Sign    Worried About Running Out of Food in the Last Year: Never true    Ran Out of Food in the Last Year: Never true  Transportation Needs: No Transportation Needs (07/06/2022)   PRAPARE - Administrator, Civil Service (Medical): No    Lack of Transportation (Non-Medical): No  Physical Activity: Sufficiently Active (07/06/2022)   Exercise Vital Sign    Days of Exercise per Week: 7 days    Minutes of Exercise per Session: 30 min  Stress: No Stress Concern Present (07/06/2022)   [employer] of Occupational Health - Occupational Stress Questionnaire    Feeling of Stress : Not at all  Social Connections: Not on file    Tobacco Counseling Counseling given: Not Answered   Clinical Intake:  Pre-visit preparation completed: Yes  Pain : No/denies pain     Nutritional Status: BMI 25 -29 Overweight Nutritional Risks: None Diabetes: Yes  How often do you need to have someone help you when you read instructions, pamphlets, or other written materials from your doctor or pharmacy?: 1 - Never  Diabetic? Yes Nutrition Risk Assessment:  Has the patient had any N/V/D within the last 2 months?  No  Does the patient have any non-healing wounds?  No  Has the patient had any unintentional weight loss or weight gain?  No   Diabetes:  Is the patient diabetic?  Yes  If diabetic, was a CBG obtained today?  No  Did the patient bring in their glucometer from home?  No  How often do you monitor your CBG's? Twice  daily.   Financial Strains and Diabetes Management:  Are you having any financial strains with the device, your supplies or your medication? No .  Does the patient want to be seen by Chronic Care Management for management of their diabetes?  No  Would the patient like to be referred to a Nutritionist or for Diabetic Management?  No   Diabetic Exams:  Diabetic Eye Exam: Completed 07/20/2021 Diabetic Foot Exam: Overdue, Pt has been advised about the importance in completing this exam. Pt is scheduled for diabetic foot exam on next appointment.   Interpreter Needed?: No  Information entered by :: NAllen LPN   Activities of Daily  Living    07/06/2022   11:37 AM  In your present state of health, do you have any difficulty performing the following activities:  Hearing? 0  Vision? 0  Difficulty concentrating or making decisions? 0  Walking or climbing stairs? 0  Dressing or bathing? 0  Doing errands, shopping? 0  Preparing Food and eating ? N  Using the Toilet? N  In the past six months, have you accidently leaked urine? N  Do you have problems with loss of bowel control? N  Managing your Medications? N  Managing your Finances? N  Housekeeping or managing your Housekeeping? N    Patient Care Team: Ronnald Nian, MD as PCP - General (Family Medicine)  Indicate any recent Medical Services you may have received from other than [facility] providers in the past year (date may be approximate).     Assessment:   This is a routine wellness examination for Kassem.  Hearing/Vision screen Vision Screening - Comments:: Regular eye exams, [facility]  Dietary issues and exercise activities discussed: Current Exercise Habits: Home exercise routine, Type of exercise: walking, Time (Minutes): 30, Frequency (Times/Week): 7, Weekly Exercise (Minutes/Week): 210   Goals Addressed             This Visit's Progress    Patient Stated       07/06/2022, wants to lose more weight        Depression Screen    07/06/2022   11:36 AM 06/09/2021    3:58 PM 05/25/2021    1:21 PM 05/22/2020    8:29 AM 09/22/2018    8:19 AM 05/11/2016    8:19 AM 01/13/2015    8:28 AM  PHQ 2/9 Scores  PHQ - 2 Score 0 0 0 0 0 0 0  PHQ- 9 Score 0          Fall Risk    07/06/2022   11:36 AM 04/13/2022    9:35 AM 06/09/2021    3:58 PM 05/25/2021    1:20 PM 05/22/2020    8:28 AM  Fall Risk   Falls in the past year? 0 0 0 0 0  Number falls in past yr: 0 0 0 0 0  Injury with Fall? 0 0 0 0 0  Risk for fall due to : Medication side effect No Fall Risks No Fall Risks No Fall Risks No Fall Risks  Follow up Falls prevention discussed;Education provided;Falls evaluation completed Falls evaluation completed Falls evaluation completed Falls evaluation completed Falls evaluation completed    FALL RISK PREVENTION PERTAINING TO THE HOME:  Any stairs in or around the home? No  If so, are there any without handrails? N/a Home free of loose throw rugs in walkways, pet beds, electrical cords, etc? Yes  Adequate lighting in your home to reduce risk of falls? Yes   ASSISTIVE DEVICES UTILIZED TO PREVENT FALLS:  Life alert? No  Use of a cane, walker or w/c? No  Grab bars in the bathroom? Yes  Shower chair or bench in shower? No  Elevated toilet seat or a handicapped toilet? No   TIMED UP AND GO:  Was the test performed? No .      Cognitive Function:        07/06/2022   11:38 AM 06/09/2021    4:00 PM  6CIT Screen  What Year? 0 points 0 points  What month? 0 points 0 points  What time? 0 points 0 points  Count back from 20 0 points 0 points  Months in reverse 0 points 0 points  Repeat phrase 2 points 0 points  Total Score 2 points 0 points    Immunizations Immunization History  Administered Date(s) Administered   COVID-19, mRNA, vaccine(Comirnaty)12 years and older 12/09/2021   Fluad Quad(high Dose 65+) 12/09/2020   Influenza Split 11/30/2010, 12/03/2011   Influenza Whole 12/02/2006,  11/03/2007, 11/11/2009   Influenza,inj,Quad PF,6+ Mos 12/08/2012, 10/31/2013, 11/19/2014, 12/05/2015, 11/04/2016, 11/24/2017, 10/24/2018, 12/19/2019, 10/05/2021   PFIZER(Purple Top)SARS-COV-2 Vaccination 04/30/2019, 05/21/2019, 12/19/2019   Pfizer Covid-19 Vaccine Bivalent Booster 55yrs & up 01/26/2021   Pneumococcal Conjugate-13 10/31/2013   Pneumococcal Polysaccharide-23 05/02/2015   Tdap 09/24/2009, 02/28/2019   Zoster Recombinat (Shingrix) 05/11/2016, 11/04/2016   Zoster, Live 05/02/2015    TDAP status: Up to date  Flu Vaccine status: Up to date  Pneumococcal vaccine status: Up to date  Covid-19 vaccine status: Completed vaccines  Qualifies for Shingles Vaccine? Yes   Zostavax completed Yes   Shingrix Completed?: Yes  Screening Tests Health Maintenance  Topic Date Due   Diabetic kidney evaluation - Urine ACR  06/03/2022   FOOT EXAM  06/03/2022   Diabetic kidney evaluation - eGFR measurement  06/10/2022   Medicare Annual Wellness (AWV)  06/10/2022   COVID-19 Vaccine (6 - 2023-24 season) 08/09/2022 (Originally 02/03/2022)   OPHTHALMOLOGY EXAM  07/21/2022   INFLUENZA VACCINE  09/16/2022   HEMOGLOBIN A1C  10/12/2022   COLONOSCOPY (Pts 45-6yrs Insurance coverage will need to be confirmed)  12/11/2025   DTaP/Tdap/Td (3 - Td or Tdap) 02/27/2029   Hepatitis C Screening  Completed   Zoster Vaccines- Shingrix  Completed   HPV VACCINES  Aged Out   Pneumonia Vaccine 48+ Years old  Discontinued    Health Maintenance  Health Maintenance Due  Topic Date Due   Diabetic kidney evaluation - Urine ACR  06/03/2022   FOOT EXAM  06/03/2022   Diabetic kidney evaluation - eGFR measurement  06/10/2022   Medicare Annual Wellness (AWV)  06/10/2022    Colorectal cancer screening: Type of screening: Colonoscopy. Completed 12/12/2015. Repeat every 10 years  Lung Cancer Screening: (Low Dose CT Chest recommended if Age 1-80 years, 30 pack-year currently smoking OR have quit w/in 15years.)  does not qualify.   Lung Cancer Screening Referral: no  Additional Screening:  Hepatitis C Screening: does qualify; Completed 01/13/2015  Vision Screening: Recommended annual ophthalmology exams for early detection of glaucoma and other disorders of the eye. Is the patient up to date with their annual eye exam?  Yes  Who is the provider or what is the name of the office in which the patient attends annual eye exams? [facility] If pt is not established with a provider, would they like to be referred to a provider to establish care? No .   Dental Screening: Recommended annual dental exams for proper oral hygiene  Community Resource Referral / Chronic Care Management: CRR required this visit?  No   CCM required this visit?  No      Plan:     I have personally reviewed and noted the following in the patient's chart:   Medical and social history Use of alcohol, tobacco or illicit drugs  Current medications and supplements including opioid prescriptions. Patient is not currently taking opioid prescriptions. Functional ability and status Nutritional status Physical activity Advanced directives List of other physicians Hospitalizations, surgeries, and ER visits in previous 12 months Vitals Screenings to include cognitive, depression, and falls Referrals and appointments  In addition, I have reviewed and discussed with  patient certain preventive protocols, quality metrics, and best practice recommendations. A written personalized care plan for preventive services as well as general preventive health recommendations were provided to patient.     Barb Merino, LPN   10/30/7827   Nurse Notes: none  Due to this being a virtual visit, the after visit summary with patients personalized plan was offered to patient via mail or my-chart.  Patient would like to access on my-chart

## 2022-07-06 NOTE — Patient Instructions (Signed)
[name] , Thank you for taking time to come for your Medicare Wellness Visit. I appreciate your ongoing commitment to your health goals. Please review the following plan we discussed and let me know if I can assist you in the future.   These are the goals we discussed:  Goals      Patient Stated     07/06/2022, wants to lose more weight        This is a list of the screening recommended for you and due dates:  Health Maintenance  Topic Date Due   Yearly kidney health urinalysis for diabetes  06/03/2022   Complete foot exam   06/03/2022   Yearly kidney function blood test for diabetes  06/10/2022   COVID-19 Vaccine (6 - 2023-24 season) 08/09/2022*   Eye exam for diabetics  07/21/2022   Flu Shot  09/16/2022   Hemoglobin A1C  10/12/2022   Medicare Annual Wellness Visit  07/06/2023   Colon Cancer Screening  12/11/2025   DTaP/Tdap/Td vaccine (3 - Td or Tdap) 02/27/2029   Hepatitis C Screening: USPSTF Recommendation to screen - Ages 25-79 yo.  Completed   Zoster (Shingles) Vaccine  Completed   HPV Vaccine  Aged Out   Pneumonia Vaccine  Discontinued  *Topic was postponed. The date shown is not the original due date.    Advanced directives: Please bring a copy of your POA (Power of Attorney) and/or Living Will to your next appointment.   Conditions/risks identified: none  Next appointment: Follow up in one year for your annual wellness visit.   Preventive Care 67 Years and Older, Male  Preventive care refers to lifestyle choices and visits with your health care provider that can promote health and wellness. What does preventive care include? A yearly physical exam. This is also called an annual well check. Dental exams once or twice a year. Routine eye exams. Ask your health care provider how often you should have your eyes checked. Personal lifestyle choices, including: Daily care of your teeth and gums. Regular physical activity. Eating a healthy diet. Avoiding tobacco  and drug use. Limiting alcohol use. Practicing safe sex. Taking low doses of aspirin every day. Taking vitamin and mineral supplements as recommended by your health care provider. What happens during an annual well check? The services and screenings done by your health care provider during your annual well check will depend on your age, overall health, lifestyle risk factors, and family history of disease. Counseling  Your health care provider may ask you questions about your: Alcohol use. Tobacco use. Drug use. Emotional well-being. Home and relationship well-being. Sexual activity. Eating habits. History of falls. Memory and ability to understand (cognition). Work and work Astronomer. Screening  You may have the following tests or measurements: Height, weight, and BMI. Blood pressure. Lipid and cholesterol levels. These may be checked every 5 years, or more frequently if you are over 19 years old. Skin check. Lung cancer screening. You may have this screening every year starting at age 35 if you have a 30-pack-year history of smoking and currently smoke or have quit within the past 15 years. Fecal occult blood test (FOBT) of the stool. You may have this test every year starting at age 26. Flexible sigmoidoscopy or colonoscopy. You may have a sigmoidoscopy every 5 years or a colonoscopy every 10 years starting at age 47. Prostate cancer screening. Recommendations will vary depending on your family history and other risks. Hepatitis C blood test. Hepatitis B blood test. Sexually  transmitted disease (STD) testing. Diabetes screening. This is done by checking your blood sugar (glucose) after you have not eaten for a while (fasting). You may have this done every 1-3 years. Abdominal aortic aneurysm (AAA) screening. You may need this if you are a current or former smoker. Osteoporosis. You may be screened starting at age 51 if you are at high risk. Talk with your health care provider  about your test results, treatment options, and if necessary, the need for more tests. Vaccines  Your health care provider may recommend certain vaccines, such as: Influenza vaccine. This is recommended every year. Tetanus, diphtheria, and acellular pertussis (Tdap, Td) vaccine. You may need a Td booster every 10 years. Zoster vaccine. You may need this after age 35. Pneumococcal 13-valent conjugate (PCV13) vaccine. One dose is recommended after age 74. conjugate (PCV13) vaccine. One dose is recommended after age 74. Pneumococcal polysaccharide (PPSV23) vaccine. One dose is recommended after age 15. Talk to your health care provider about which screenings and vaccines you need and how often you need them. This information is not intended to replace advice given to you by your health care provider. Make sure you discuss any questions you have with your health care provider. Document Released: 02/28/2015 Document Revised: 10/22/2015 Document Reviewed: 12/03/2014 Elsevier Interactive Patient Education  2017 [employer].  Fall Prevention in the Home Falls can cause injuries. They can happen to people of all ages. There are many things you can do to make your home safe and to help prevent falls. What can I do on the outside of my home? Regularly fix the edges of walkways and driveways and fix any cracks. Remove anything that might make you trip as you walk through a door, such as a raised step or threshold. Trim any bushes or trees on the path to your home. Use bright outdoor lighting. Clear any walking paths of anything that might make someone trip, such as rocks or tools. Regularly check to see if handrails are loose or broken. Make sure that both sides of any steps have handrails. Any raised decks and porches should have guardrails on the edges. Have any leaves, snow, or ice cleared regularly. Use sand or salt on walking paths during winter. Clean up any spills in your garage right away. This includes oil or grease spills. What can I do in the  bathroom? Use night lights. Install grab bars by the toilet and in the tub and shower. Do not use towel bars as grab bars. Use non-skid mats or decals in the tub or shower. If you need to sit down in the shower, use a plastic, non-slip stool. Keep the floor dry. Clean up any water that spills on the floor as soon as it happens. Remove soap buildup in the tub or shower regularly. Attach bath mats securely with double-sided non-slip rug tape. Do not have throw rugs and other things on the floor that can make you trip. What can I do in the bedroom? Use night lights. Make sure that you have a light by your bed that is easy to reach. Do not use any sheets or blankets that are too big for your bed. They should not hang down onto the floor. Have a firm chair that has side arms. You can use this for support while you get dressed. Do not have throw rugs and other things on the floor that can make you trip. What can I do in the kitchen? Clean up any spills right away. Avoid walking on wet floors. Keep items that you use  a lot in easy-to-reach places. If you need to reach something above you, use a strong step stool that has a grab bar. Keep electrical cords out of the way. Do not use floor polish or wax that makes floors slippery. If you must use wax, use non-skid floor wax. Do not have throw rugs and other things on the floor that can make you trip. What can I do with my stairs? Do not leave any items on the stairs. Make sure that there are handrails on both sides of the stairs and use them. Fix handrails that are broken or loose. Make sure that handrails are as long as the stairways. Check any carpeting to make sure that it is firmly attached to the stairs. Fix any carpet that is loose or worn. Avoid having throw rugs at the top or bottom of the stairs. If you do have throw rugs, attach them to the floor with carpet tape. Make sure that you have a light switch at the top of the stairs and the  bottom of the stairs. If you do not have them, ask someone to add them for you. What else can I do to help prevent falls? Wear shoes that: Do not have high heels. Have rubber bottoms. Are comfortable and fit you well. Are closed at the toe. Do not wear sandals. If you use a stepladder: Make sure that it is fully opened. Do not climb a closed stepladder. Make sure that both sides of the stepladder are locked into place. Ask someone to hold it for you, if possible. Clearly mark and make sure that you can see: Any grab bars or handrails. First and last steps. Where the edge of each step is. Use tools that help you move around (mobility aids) if they are needed. These include: Canes. Walkers. Scooters. Crutches. Turn on the lights when you go into a dark area. Replace any light bulbs as soon as they burn out. Set up your furniture so you have a clear path. Avoid moving your furniture around. If any of your floors are uneven, fix them. If there are any pets around you, be aware of where they are. Review your medicines with your doctor. Some medicines can make you feel dizzy. This can increase your chance of falling. Ask your doctor what other things that you can do to help prevent falls. This information is not intended to replace advice given to you by your health care provider. Make sure you discuss any questions you have with your health care provider. Document Released: 11/28/2008 Document Revised: 07/10/2015 Document Reviewed: 03/08/2014 Elsevier Interactive Patient Education  2017 [employer].

## 2022-07-17 ENCOUNTER — Telehealth: Payer: Self-pay | Admitting: Family Medicine

## 2022-07-17 NOTE — Telephone Encounter (Signed)
Pt called & states he only has 1 more Trulicity left & hasn't received Pt Assistance in a while & he is in the donut hole.  I printed out a new Pt Assistance application & he will come by the office & complete it

## 2022-07-19 DIAGNOSIS — K080 Exfoliation of teeth due to systemic causes: Secondary | ICD-10-CM | POA: Diagnosis not present

## 2022-07-23 DIAGNOSIS — H5213 Myopia, bilateral: Secondary | ICD-10-CM | POA: Diagnosis not present

## 2022-07-23 LAB — HM DIABETES EYE EXAM

## 2022-07-26 ENCOUNTER — Other Ambulatory Visit: Payer: Self-pay | Admitting: Family Medicine

## 2022-07-26 ENCOUNTER — Telehealth: Payer: Self-pay | Admitting: Family Medicine

## 2022-07-26 DIAGNOSIS — E118 Type 2 diabetes mellitus with unspecified complications: Secondary | ICD-10-CM

## 2022-07-26 NOTE — Telephone Encounter (Signed)
Pt Assistance application was faxed 07/22/22,  I called [employer] Pt Assistance & they didn't receive.  I refaxed to [facility].  Asked checked & no samples are available.  Left message for pt

## 2022-07-26 NOTE — Telephone Encounter (Signed)
Pt Assistance application was faxed 07/22/22,  I called [employer] Pt Assistance & they didn't receive.  I refaxed to [facility].  Also checked & no samples are available. Pt was supposed to take his weekly injection on Saturday.   Is it ok for him to be out of this for a few weeks until he gets set up on Patient assistance or can he be switched to a sample of Ozempic that we have to hold him temporarily & if so does he start with 0.25 or 0.50 mg  ?

## 2022-07-27 NOTE — Telephone Encounter (Signed)
done

## 2022-07-27 NOTE — Telephone Encounter (Signed)
Called Paul Long & pt was approved & will ship in 7-10 business days,  pt given 1 sample Ozempic to hold per Dr. Susann Givens with instructions

## 2022-07-28 ENCOUNTER — Other Ambulatory Visit: Payer: Self-pay | Admitting: Family Medicine

## 2022-07-28 DIAGNOSIS — E118 Type 2 diabetes mellitus with unspecified complications: Secondary | ICD-10-CM

## 2022-07-29 ENCOUNTER — Other Ambulatory Visit: Payer: Self-pay | Admitting: Family Medicine

## 2022-07-29 DIAGNOSIS — E118 Type 2 diabetes mellitus with unspecified complications: Secondary | ICD-10-CM

## 2022-07-29 NOTE — Telephone Encounter (Signed)
Dosage was changed from 10mg  to 25mg  on 04/13/2022. New prescription for Jardiance 25mg  dose 90 day supply with 1 refill was sent to pharmacy on 04/13/2022.

## 2022-08-03 ENCOUNTER — Telehealth: Payer: Self-pay | Admitting: Family Medicine

## 2022-08-03 NOTE — Telephone Encounter (Signed)
Printed out Pt Assistance application & helped pt complete his part & will fax after doctor completes  Samples Jardiance 25mg  #3 given to hold pt

## 2022-08-03 NOTE — Telephone Encounter (Signed)
Pt states his Paul Long was over $100 at the pharmacy & he can't afford that.  Said in donut hole

## 2022-08-05 DIAGNOSIS — K080 Exfoliation of teeth due to systemic causes: Secondary | ICD-10-CM | POA: Diagnosis not present

## 2022-08-10 ENCOUNTER — Ambulatory Visit (INDEPENDENT_AMBULATORY_CARE_PROVIDER_SITE_OTHER): Payer: Medicare Other | Admitting: Family Medicine

## 2022-08-10 ENCOUNTER — Encounter: Payer: Self-pay | Admitting: Family Medicine

## 2022-08-10 VITALS — BP 110/80 | HR 80 | Temp 98.0°F | Resp 18 | Wt 209.0 lb

## 2022-08-10 DIAGNOSIS — E118 Type 2 diabetes mellitus with unspecified complications: Secondary | ICD-10-CM

## 2022-08-10 DIAGNOSIS — I152 Hypertension secondary to endocrine disorders: Secondary | ICD-10-CM

## 2022-08-10 DIAGNOSIS — E1159 Type 2 diabetes mellitus with other circulatory complications: Secondary | ICD-10-CM

## 2022-08-10 DIAGNOSIS — E1169 Type 2 diabetes mellitus with other specified complication: Secondary | ICD-10-CM | POA: Diagnosis not present

## 2022-08-10 DIAGNOSIS — J309 Allergic rhinitis, unspecified: Secondary | ICD-10-CM

## 2022-08-10 DIAGNOSIS — K219 Gastro-esophageal reflux disease without esophagitis: Secondary | ICD-10-CM

## 2022-08-10 DIAGNOSIS — E785 Hyperlipidemia, unspecified: Secondary | ICD-10-CM

## 2022-08-10 DIAGNOSIS — E669 Obesity, unspecified: Secondary | ICD-10-CM

## 2022-08-10 DIAGNOSIS — M1000 Idiopathic gout, unspecified site: Secondary | ICD-10-CM

## 2022-08-10 LAB — COMPREHENSIVE METABOLIC PANEL
ALT: 29 IU/L (ref 0–44)
Bilirubin Total: 1.2 mg/dL (ref 0.0–1.2)
CO2: 21 mmol/L (ref 20–29)
Calcium: 9.6 mg/dL (ref 8.6–10.2)
Creatinine, Ser: 0.82 mg/dL (ref 0.76–1.27)
Globulin, Total: 2.2 g/dL (ref 1.5–4.5)
Total Protein: 6.8 g/dL (ref 6.0–8.5)

## 2022-08-10 LAB — CBC WITH DIFFERENTIAL/PLATELET
EOS (ABSOLUTE): 0.1 10*3/uL (ref 0.0–0.4)
Immature Granulocytes: 0 %
MCH: 31.5 pg (ref 26.6–33.0)
MCV: 94 fL (ref 79–97)
Monocytes Absolute: 0.4 10*3/uL (ref 0.1–0.9)
WBC: 6.1 10*3/uL (ref 3.4–10.8)

## 2022-08-10 LAB — POCT UA - MICROALBUMIN
Albumin/Creatinine Ratio, Urine, POC: <6
Creatinine, POC: 83.4 mg/dL
Microalbumin Ur, POC: <5 mg/L

## 2022-08-10 LAB — LIPID PANEL: HDL: 40 mg/dL (ref >39)

## 2022-08-10 LAB — POCT GLYCOSYLATED HEMOGLOBIN (HGB A1C): Hemoglobin A1C: 6.9 % — AB (ref 4.0–5.6)

## 2022-08-10 MED ORDER — PIOGLITAZONE HCL-METFORMIN HCL 15-850 MG PO TABS: 1.0000 | ORAL_TABLET | Freq: Two times a day (BID) | ORAL | 1 refills | Status: DC

## 2022-08-10 MED ORDER — TRULICITY 1.5 MG/0.5ML ~~LOC~~ SOAJ
SUBCUTANEOUS | 1 refills | Status: DC
Start: 2022-08-10 — End: 2023-01-10

## 2022-08-10 MED ORDER — EMPAGLIFLOZIN 25 MG PO TABS: 25.0000 mg | ORAL_TABLET | Freq: Every day | ORAL | 1 refills | Status: DC

## 2022-08-10 MED ORDER — ATORVASTATIN CALCIUM 20 MG PO TABS: 20.0000 mg | ORAL_TABLET | Freq: Every day | ORAL | 3 refills | Status: DC

## 2022-08-10 MED ORDER — LISINOPRIL 5 MG PO TABS: 5.0000 mg | ORAL_TABLET | Freq: Every day | ORAL | 3 refills | Status: DC

## 2022-08-10 MED ORDER — ALLOPURINOL 300 MG PO TABS: 300.0000 mg | ORAL_TABLET | Freq: Every day | ORAL | 3 refills | Status: DC

## 2022-08-10 NOTE — Progress Notes (Signed)
Subjective:    Patient ID: Paul Long, male    DOB: [birth year], 67 y.o.   MRN: [id number]  Paul Long is a 67 y.o. male who presents for follow-up of Type 2 diabetes mellitus and a general med check.  Home blood sugar records:  varies between 140-160 Current symptoms/problems include none and have been stable. Daily foot checks: yes   Any foot concerns: no How often blood sugars checked:2 times a day Exercise: Home exercise routine includes walking 30 minutes daily. Diet: regular  He continues on Actos plus as well as Trulicity Jardiance.  He is having difficulty getting these things at a reasonable rate.  Continues on allopurinol without difficulty.  He states his blood sugars usually is below 160 after meals.  He does check his feet regularly.  He is also had an eye exam.  His allergies seem to be under good control.  He is also taking Cialis on an as-needed basis.  Continues on atorvastatin as well as lisinopril.  He does check his sugars regularly.  He is now starting to date and things are going well there.  The following portions of the patient's history were reviewed and updated as appropriate: allergies, current medications, past medical history, past social history and problem list.  ROS as in subjective above.     Objective:    Physical Exam Alert and in no distress. Tympanic membranes and canals are normal. Pharyngeal area is normal. Neck is supple without adenopathy or thyromegaly. Cardiac exam shows a regular sinus rhythm without murmurs or gallops. Lungs are clear to auscultation. Diabetic foot exam is normal.  Hemoglobin A1c is 6.9.   Lab Review    Latest Ref Rng & Units 04/13/2022    9:41 AM 06/09/2021   11:14 AM 06/02/2021    2:30 PM 06/02/2021    2:28 PM 06/02/2021    9:27 AM  Diabetic Labs  HbA1c 4.0 - 5.6 % 7.8   6.7     Microalbumin mg/L    5.4    Micro/Creat Ratio     7.9    Chol 100 - 199 mg/dL     102   HDL >72 mg/dL     45   Calc LDL 0 - 99 mg/dL      51   Triglycerides 0 - 149 mg/dL     536   Creatinine 6.44 - 1.27 mg/dL  0.34    7.42       5/95/6387   11:31 AM 04/13/2022    9:34 AM 01/13/2022    2:45 PM 01/11/2022   10:00 AM 11/18/2021   10:15 AM  BP/Weight  Systolic BP  114   100  Diastolic BP  86   60  Wt. (Lbs) 210 214 200 200 202.2  BMI 29.29 kg/m2 29.85 kg/m2 27.89 kg/m2 27.89 kg/m2 29.86 kg/m2      Latest Ref Rng & Units 07/23/2022   12:00 AM 07/20/2021   12:00 AM  Foot/eye exam completion dates  Eye Exam No Retinopathy No Retinopathy     No Retinopathy         This result is from an external source.    Paul Long  reports that he has never smoked. He has never used smokeless tobacco. He reports that he does not drink alcohol and does not use drugs.     Assessment & Plan:    Diabetes mellitus type 2 with complications (HCC) - Plan: POCT glycosylated hemoglobin (Hb A1C)  Hypertension associated  with diabetes (HCC)  Hyperlipidemia associated with type 2 diabetes mellitus (HCC)  Idiopathic gout, unspecified chronicity, unspecified site Congratulated him on getting his blood sugars down.  Encouraged him to continue with his diet and exercise programs.  Continue on present medication regimen.  We will readjust based on his blood work.

## 2022-08-11 LAB — LIPID PANEL
Chol/HDL Ratio: 2.7 ratio (ref 0.0–5.0)
Cholesterol, Total: 106 mg/dL (ref 100–199)
LDL Chol Calc (NIH): 41 mg/dL (ref 0–99)
Triglycerides: 142 mg/dL (ref 0–149)
VLDL Cholesterol Cal: 25 mg/dL (ref 5–40)

## 2022-08-11 LAB — CBC WITH DIFFERENTIAL/PLATELET
Basophils Absolute: 0.1 10*3/uL (ref 0.0–0.2)
Basos: 1 %
Eos: 2 %
Hematocrit: 48.5 % (ref 37.5–51.0)
Hemoglobin: 16.3 g/dL (ref 13.0–17.7)
Immature Grans (Abs): 0 10*3/uL (ref 0.0–0.1)
Lymphocytes Absolute: 2.2 10*3/uL (ref 0.7–3.1)
Lymphs: 35 %
MCHC: 33.6 g/dL (ref 31.5–35.7)
Monocytes: 6 %
Neutrophils Absolute: 3.4 10*3/uL (ref 1.4–7.0)
Neutrophils: 56 %
Platelets: 250 10*3/uL (ref 150–450)
RBC: 5.18 x10E6/uL (ref 4.14–5.80)
RDW: 12.7 % (ref 11.6–15.4)

## 2022-08-11 LAB — COMPREHENSIVE METABOLIC PANEL
AST: 25 IU/L (ref 0–40)
Albumin: 4.6 g/dL (ref 3.9–4.9)
Alkaline Phosphatase: 116 IU/L (ref 44–121)
BUN/Creatinine Ratio: 16 (ref 10–24)
BUN: 13 mg/dL (ref 8–27)
Chloride: 103 mmol/L (ref 96–106)
Glucose: 108 mg/dL — ABNORMAL HIGH (ref 70–99)
Potassium: 4.2 mmol/L (ref 3.5–5.2)
Sodium: 141 mmol/L (ref 134–144)
eGFR: 96 mL/min/{1.73_m2} (ref >59)

## 2022-08-11 LAB — URIC ACID: Uric Acid: 3.2 mg/dL — ABNORMAL LOW (ref 3.8–8.4)

## 2022-08-20 DIAGNOSIS — G4733 Obstructive sleep apnea (adult) (pediatric): Secondary | ICD-10-CM | POA: Diagnosis not present

## 2022-09-08 DIAGNOSIS — G4733 Obstructive sleep apnea (adult) (pediatric): Secondary | ICD-10-CM | POA: Diagnosis not present

## 2022-09-28 ENCOUNTER — Encounter: Payer: Self-pay | Admitting: Family Medicine

## 2022-09-28 ENCOUNTER — Ambulatory Visit: Payer: Medicare Other | Admitting: Family Medicine

## 2022-09-28 DIAGNOSIS — K219 Gastro-esophageal reflux disease without esophagitis: Secondary | ICD-10-CM

## 2022-09-28 DIAGNOSIS — J309 Allergic rhinitis, unspecified: Secondary | ICD-10-CM

## 2022-09-28 DIAGNOSIS — E118 Type 2 diabetes mellitus with unspecified complications: Secondary | ICD-10-CM

## 2022-09-28 DIAGNOSIS — M1000 Idiopathic gout, unspecified site: Secondary | ICD-10-CM

## 2022-09-28 DIAGNOSIS — I152 Hypertension secondary to endocrine disorders: Secondary | ICD-10-CM

## 2022-09-28 DIAGNOSIS — E669 Obesity, unspecified: Secondary | ICD-10-CM

## 2022-09-28 DIAGNOSIS — E1169 Type 2 diabetes mellitus with other specified complication: Secondary | ICD-10-CM

## 2022-09-28 DIAGNOSIS — G4733 Obstructive sleep apnea (adult) (pediatric): Secondary | ICD-10-CM

## 2022-09-28 DIAGNOSIS — Z0000 Encounter for general adult medical examination without abnormal findings: Secondary | ICD-10-CM

## 2022-10-07 ENCOUNTER — Ambulatory Visit (INDEPENDENT_AMBULATORY_CARE_PROVIDER_SITE_OTHER): Payer: Medicare Other | Admitting: Family Medicine

## 2022-10-07 ENCOUNTER — Encounter: Payer: Self-pay | Admitting: Family Medicine

## 2022-10-07 VITALS — BP 114/72 | HR 86 | Temp 98.0°F | Resp 16 | Wt 212.0 lb

## 2022-10-07 DIAGNOSIS — M79672 Pain in left foot: Secondary | ICD-10-CM

## 2022-10-07 NOTE — Progress Notes (Signed)
   Subjective:    Patient ID: Paul Long, male    DOB: Dec 24, 1955, 67 y.o.   MRN: [id number]  HPI He complains of a 10-day history of left foot pain around the second and third metatarsals.  It does get worse with walking.  He does occasionally get a burning sensation in that area.  He has bought a new pair of shoes and did get an orthotic in it which he states only minimally helps.  No history of injury or overuse.   Review of Systems     Objective:    Physical Exam Exam of the foot shows good motion but some tenderness over the second metatarsal head.  Compression testing was negative.  Skin appears normal.       Assessment & Plan:  Left foot pain I did give information concerning Morton's neuroma although he is not really having any pain in that area.  Recommend that he get a better orthotic and make sure that shoes are fitting looser.  If he continues have difficulty I might refer him on.

## 2022-10-07 NOTE — Patient Instructions (Signed)
Morton Neuralgia  Morton neuralgia is foot pain that affects the ball of the foot and the area near the toes. Morton neuralgia occurs when part of a nerve in the foot (digital nerve) is under too much pressure (compressed). When this happens over a long period of time, the nerve can thicken (neuroma) and cause pain. Pain usually occurs between the third and fourth toes.  Morton neuralgia can come and go but may get worse over time. What are the causes? This condition is caused by doing the same things over and over with your foot, such as: Activities such as running or jumping. Wearing shoes that are too tight. What increases the risk? You may be at higher risk for Morton neuralgia if you: Are male. Wear high heels. Wear shoes that are narrow or tight. Do activities that repeatedly stretch your toes, such as: Running. Ballet. Long-distance walking. What are the signs or symptoms? The first symptom of Morton neuralgia is pain that spreads from the ball of the foot to the toes. It may feel like you are walking on a marble. Pain usually gets worse with walking and goes away at night. Other symptoms may include numbness and cramping of your toes. Both feet are equally affected, but rarely at the same time. How is this diagnosed? This condition is diagnosed based on your symptoms, your medical history, and a physical exam. Your health care provider may: Squeeze your foot just behind your toe. Ask you to move your toes to check for pain. Ask about your physical activity level. You also may have imaging tests, such as an X-ray, ultrasound, or MRI. How is this treated? Treatment depends on how severe your condition is and what causes it. Treatment may involve: Wearing different shoes that are not too tight, are low-heeled, and provide good support. For some people, this is the only treatment needed. Wearing an over-the-counter or custom supportive pad (orthotic) under the front of your  foot. Getting injections of numbing medicine and anti-inflammatory medicine (steroid) in the nerve. Having surgery to remove part of the thickened nerve. Follow these instructions at home: Managing pain, stiffness, and swelling  Massage your foot as needed. Wear orthotics as told by your health care provider. If directed, put ice on the painful area. To do this: Put ice in a plastic bag. Place a towel between your skin and the bag. Leave the ice on for 20 minutes, 2-3 times a day. Remove the ice if your skin turns bright red. This is very important. If you cannot feel pain, heat, or cold, you have a greater risk of damage to the area. Raise (elevate) the injured area above the level of your heart while you are sitting or lying down. Avoid activities that cause pain or make pain worse. If you play sports, ask your health care provider when it is safe for you to return to sports. General instructions Take over-the-counter and prescription medicines only as told by your health care provider. For the time period you were told by your health care provider, do not drive or use machinery. Wear shoes that: Have soft soles. Have a wide toe area. Provide arch support. Do not pinch or squeeze your feet. Have room for your orthotics, if this applies. Keep all follow-up visits. This is important. Contact a health care provider if: Your symptoms get worse or do not get better with treatment and home care. Summary Morton neuralgia is foot pain that affects the ball of the foot and the  area near the toes. Pain usually occurs between the third and fourth toes, gets worse with walking, and goes away at night. Morton neuralgia occurs when part of a nerve in the foot (digital nerve) is under too much pressure. When this happens over a long period of time, the nerve can thicken (neuroma) and cause pain. This condition is caused by doing the same things over and over with your foot, such as running or  jumping, wearing shoes that are too tight, or wearing high heels. Treatment may involve wearing low-heeled shoes that are not too tight, wearing a supportive pad (orthotic) under the front of your foot, getting injections in the nerve, or having surgery to remove part of the thickened nerve. This information is not intended to replace advice given to you by your health care provider. Make sure you discuss any questions you have with your health care provider. Document Revised: 07/11/2020 Document Reviewed: 07/11/2020 Elsevier Patient Education  2024 [employer].

## 2022-10-07 NOTE — Telephone Encounter (Signed)
Pt assistance denied until pt files for LIS program, gave letter and samples to pt

## 2022-10-17 DIAGNOSIS — E119 Type 2 diabetes mellitus without complications: Secondary | ICD-10-CM | POA: Diagnosis not present

## 2022-10-19 ENCOUNTER — Other Ambulatory Visit (INDEPENDENT_AMBULATORY_CARE_PROVIDER_SITE_OTHER): Payer: Medicare Other

## 2022-10-19 DIAGNOSIS — Z23 Encounter for immunization: Secondary | ICD-10-CM | POA: Diagnosis not present

## 2022-12-02 ENCOUNTER — Telehealth: Payer: Self-pay | Admitting: Family Medicine

## 2022-12-02 NOTE — Telephone Encounter (Signed)
Pt stopped by office & states he talked to [facility] again regarding his Pt Assistance for Jardiance & now they are saying he needs a prescription from doctor's office.  (First was LIS, which he completed, second was signatures, now Rx) Application was faxed 08/10/22 which includes the prescription

## 2022-12-03 NOTE — Telephone Encounter (Signed)
Spoke with [employer] in regards to patient's Jardiance application. No information is missing, representative sent application up for final process. Should receive decision response in 3-7 business days. Patient notified.  Sherrill Raring, PharmD Clinical Pharmacist [phone]

## 2022-12-10 DIAGNOSIS — G4733 Obstructive sleep apnea (adult) (pediatric): Secondary | ICD-10-CM | POA: Diagnosis not present

## 2022-12-16 DIAGNOSIS — G4733 Obstructive sleep apnea (adult) (pediatric): Secondary | ICD-10-CM | POA: Diagnosis not present

## 2022-12-25 ENCOUNTER — Other Ambulatory Visit: Payer: Self-pay | Admitting: Family Medicine

## 2022-12-25 DIAGNOSIS — N529 Male erectile dysfunction, unspecified: Secondary | ICD-10-CM

## 2022-12-27 NOTE — Telephone Encounter (Signed)
Last apt. 10/07/22.

## 2023-01-09 DIAGNOSIS — N5201 Erectile dysfunction due to arterial insufficiency: Secondary | ICD-10-CM | POA: Insufficient documentation

## 2023-01-10 ENCOUNTER — Ambulatory Visit (INDEPENDENT_AMBULATORY_CARE_PROVIDER_SITE_OTHER): Payer: Medicare Other | Admitting: Family Medicine

## 2023-01-10 ENCOUNTER — Encounter: Payer: Self-pay | Admitting: Family Medicine

## 2023-01-10 VITALS — BP 110/68 | HR 94 | Ht 70.5 in | Wt 212.0 lb

## 2023-01-10 DIAGNOSIS — E1169 Type 2 diabetes mellitus with other specified complication: Secondary | ICD-10-CM

## 2023-01-10 DIAGNOSIS — E785 Hyperlipidemia, unspecified: Secondary | ICD-10-CM

## 2023-01-10 DIAGNOSIS — K219 Gastro-esophageal reflux disease without esophagitis: Secondary | ICD-10-CM | POA: Diagnosis not present

## 2023-01-10 DIAGNOSIS — M1000 Idiopathic gout, unspecified site: Secondary | ICD-10-CM | POA: Diagnosis not present

## 2023-01-10 DIAGNOSIS — J301 Allergic rhinitis due to pollen: Secondary | ICD-10-CM | POA: Diagnosis not present

## 2023-01-10 DIAGNOSIS — E669 Obesity, unspecified: Secondary | ICD-10-CM

## 2023-01-10 DIAGNOSIS — N5201 Erectile dysfunction due to arterial insufficiency: Secondary | ICD-10-CM

## 2023-01-10 DIAGNOSIS — I152 Hypertension secondary to endocrine disorders: Secondary | ICD-10-CM

## 2023-01-10 DIAGNOSIS — Z23 Encounter for immunization: Secondary | ICD-10-CM

## 2023-01-10 DIAGNOSIS — G4733 Obstructive sleep apnea (adult) (pediatric): Secondary | ICD-10-CM

## 2023-01-10 DIAGNOSIS — E1159 Type 2 diabetes mellitus with other circulatory complications: Secondary | ICD-10-CM

## 2023-01-10 DIAGNOSIS — E118 Type 2 diabetes mellitus with unspecified complications: Secondary | ICD-10-CM | POA: Diagnosis not present

## 2023-01-10 LAB — POCT GLYCOSYLATED HEMOGLOBIN (HGB A1C): Hemoglobin A1C: 8 % — AB (ref 4.0–5.6)

## 2023-01-10 MED ORDER — TRULICITY 3 MG/0.5ML ~~LOC~~ SOAJ
3.0000 mg | SUBCUTANEOUS | 1 refills | Status: DC
Start: 2023-01-10 — End: 2023-09-28

## 2023-01-10 NOTE — Progress Notes (Signed)
   Subjective:    Patient ID: Paul Long, male    DOB: [birth year], 67 y.o.   MRN: [id number]  HPI He is here for an interval evaluation.  He did recently have a Medicare wellness exam done.  His allergies are under good control.  He continues on his Trulicity, [place] and Actos plus and is having no difficulty with that.  He does have reflux symptoms but they are not causing a lot of difficulty at the present time.  He continues on allopurinol and has had no gout attacks.  He continues to state a woman and things are going well there.  Continues on Cialis.  He is also taking low-dose Zestril as well as Lipitor and allopurinol for his gout.  He is using his CPAP with good results.  Otherwise he has no particular concerns or complaints.   Review of Systems     Objective:    Physical Exam Alert and in no distress. Tympanic membranes and canals are normal. Pharyngeal area is normal. Neck is supple without adenopathy or thyromegaly. Cardiac exam shows a regular sinus rhythm without murmurs or gallops. Lungs are clear to auscultation.        Assessment & Plan:  Diabetes mellitus type 2 with complications (HCC) - Plan: POCT glycosylated hemoglobin (Hb A1C), Dulaglutide (TRULICITY) 3 MG/0.5ML SOAJ  Seasonal allergic rhinitis due to pollen  Gastroesophageal reflux disease without esophagitis  Idiopathic gout, unspecified chronicity, unspecified site  Hyperlipidemia associated with type 2 diabetes mellitus (HCC)  Hypertension associated with diabetes (HCC)  Obesity (BMI 30-39.9)  Obstructive sleep apnea (adult) (pediatric)  Erectile dysfunction due to arterial insufficiency  Need for COVID-19 vaccine - Plan: Pfizer Comirnaty Covid -19 Vaccine 62yrs and older  Continue on present medication regimen with an increase in his Trulicity.  Again encouraged diet and exercise changes and we will reevaluate this in about 4 months.  Continue on his CPAP.

## 2023-01-15 NOTE — Telephone Encounter (Signed)
Recv'd letter form [employer] pt is approved for 2025 Jardiance

## 2023-01-28 ENCOUNTER — Telehealth: Payer: Self-pay | Admitting: Family Medicine

## 2023-01-28 NOTE — Telephone Encounter (Signed)
PAP [employer] TRULICITY 2025 done & approved

## 2023-02-01 DIAGNOSIS — K080 Exfoliation of teeth due to systemic causes: Secondary | ICD-10-CM | POA: Diagnosis not present

## 2023-02-16 DIAGNOSIS — E119 Type 2 diabetes mellitus without complications: Secondary | ICD-10-CM | POA: Diagnosis not present

## 2023-02-17 ENCOUNTER — Encounter: Payer: Medicare Other | Admitting: Family Medicine

## 2023-02-24 DIAGNOSIS — G4733 Obstructive sleep apnea (adult) (pediatric): Secondary | ICD-10-CM | POA: Diagnosis not present

## 2023-03-19 DIAGNOSIS — E119 Type 2 diabetes mellitus without complications: Secondary | ICD-10-CM | POA: Diagnosis not present

## 2023-04-03 ENCOUNTER — Other Ambulatory Visit: Payer: Self-pay | Admitting: Family Medicine

## 2023-04-03 DIAGNOSIS — N529 Male erectile dysfunction, unspecified: Secondary | ICD-10-CM

## 2023-04-04 ENCOUNTER — Other Ambulatory Visit: Payer: Self-pay | Admitting: Family Medicine

## 2023-04-04 DIAGNOSIS — N529 Male erectile dysfunction, unspecified: Secondary | ICD-10-CM

## 2023-04-05 ENCOUNTER — Other Ambulatory Visit: Payer: Self-pay | Admitting: Family Medicine

## 2023-04-05 DIAGNOSIS — N529 Male erectile dysfunction, unspecified: Secondary | ICD-10-CM

## 2023-04-12 ENCOUNTER — Encounter: Payer: Self-pay | Admitting: Internal Medicine

## 2023-04-13 DIAGNOSIS — G4733 Obstructive sleep apnea (adult) (pediatric): Secondary | ICD-10-CM | POA: Diagnosis not present

## 2023-04-14 ENCOUNTER — Encounter: Payer: Self-pay | Admitting: Family Medicine

## 2023-04-15 MED ORDER — SILDENAFIL CITRATE 100 MG PO TABS: 100.0000 mg | ORAL_TABLET | Freq: Every day | ORAL | 0 refills | Status: DC | PRN

## 2023-04-16 DIAGNOSIS — E119 Type 2 diabetes mellitus without complications: Secondary | ICD-10-CM | POA: Diagnosis not present

## 2023-04-21 ENCOUNTER — Other Ambulatory Visit: Payer: Self-pay | Admitting: Family Medicine

## 2023-04-21 DIAGNOSIS — E118 Type 2 diabetes mellitus with unspecified complications: Secondary | ICD-10-CM

## 2023-04-22 ENCOUNTER — Other Ambulatory Visit (HOSPITAL_COMMUNITY): Payer: Self-pay

## 2023-04-22 ENCOUNTER — Other Ambulatory Visit: Payer: Self-pay

## 2023-04-22 DIAGNOSIS — E118 Type 2 diabetes mellitus with unspecified complications: Secondary | ICD-10-CM

## 2023-04-22 MED ORDER — PIOGLITAZONE HCL-METFORMIN HCL 15-850 MG PO TABS
1.0000 | ORAL_TABLET | Freq: Two times a day (BID) | ORAL | 0 refills | Status: DC
  Filled 2023-04-22: qty 60, 30d supply, fill #0
  Filled 2023-05-17: qty 60, 30d supply, fill #1
  Filled 2023-06-16: qty 60, 30d supply, fill #2

## 2023-05-10 ENCOUNTER — Encounter: Payer: Medicare Other | Admitting: Family Medicine

## 2023-05-10 DIAGNOSIS — J301 Allergic rhinitis due to pollen: Secondary | ICD-10-CM

## 2023-05-10 DIAGNOSIS — E1159 Type 2 diabetes mellitus with other circulatory complications: Secondary | ICD-10-CM

## 2023-05-10 DIAGNOSIS — G4733 Obstructive sleep apnea (adult) (pediatric): Secondary | ICD-10-CM

## 2023-05-10 DIAGNOSIS — K219 Gastro-esophageal reflux disease without esophagitis: Secondary | ICD-10-CM

## 2023-05-10 DIAGNOSIS — E1169 Type 2 diabetes mellitus with other specified complication: Secondary | ICD-10-CM

## 2023-05-10 DIAGNOSIS — M1000 Idiopathic gout, unspecified site: Secondary | ICD-10-CM

## 2023-05-10 DIAGNOSIS — E118 Type 2 diabetes mellitus with unspecified complications: Secondary | ICD-10-CM

## 2023-05-10 DIAGNOSIS — E669 Obesity, unspecified: Secondary | ICD-10-CM

## 2023-05-10 DIAGNOSIS — N5201 Erectile dysfunction due to arterial insufficiency: Secondary | ICD-10-CM

## 2023-05-17 ENCOUNTER — Other Ambulatory Visit (HOSPITAL_COMMUNITY): Payer: Self-pay

## 2023-05-17 ENCOUNTER — Other Ambulatory Visit: Payer: Self-pay

## 2023-05-17 DIAGNOSIS — E119 Type 2 diabetes mellitus without complications: Secondary | ICD-10-CM | POA: Diagnosis not present

## 2023-05-18 ENCOUNTER — Other Ambulatory Visit (HOSPITAL_COMMUNITY): Payer: Self-pay

## 2023-05-24 ENCOUNTER — Encounter: Payer: Self-pay | Admitting: Family Medicine

## 2023-05-24 ENCOUNTER — Ambulatory Visit (INDEPENDENT_AMBULATORY_CARE_PROVIDER_SITE_OTHER): Admitting: Family Medicine

## 2023-05-24 VITALS — BP 115/78 | HR 95 | Wt 206.6 lb

## 2023-05-24 DIAGNOSIS — E118 Type 2 diabetes mellitus with unspecified complications: Secondary | ICD-10-CM | POA: Diagnosis not present

## 2023-05-24 DIAGNOSIS — M1000 Idiopathic gout, unspecified site: Secondary | ICD-10-CM

## 2023-05-24 DIAGNOSIS — N5201 Erectile dysfunction due to arterial insufficiency: Secondary | ICD-10-CM

## 2023-05-24 DIAGNOSIS — Z23 Encounter for immunization: Secondary | ICD-10-CM | POA: Diagnosis not present

## 2023-05-24 DIAGNOSIS — E785 Hyperlipidemia, unspecified: Secondary | ICD-10-CM

## 2023-05-24 DIAGNOSIS — E1159 Type 2 diabetes mellitus with other circulatory complications: Secondary | ICD-10-CM

## 2023-05-24 DIAGNOSIS — I152 Hypertension secondary to endocrine disorders: Secondary | ICD-10-CM

## 2023-05-24 DIAGNOSIS — E1169 Type 2 diabetes mellitus with other specified complication: Secondary | ICD-10-CM

## 2023-05-24 LAB — POCT GLYCOSYLATED HEMOGLOBIN (HGB A1C): Hemoglobin A1C: 7.4 % — AB (ref 4.0–5.6)

## 2023-05-24 NOTE — Progress Notes (Signed)
   Subjective:    Patient ID: Paul Long, male    DOB: 1955-08-27, 68 y.o.   MRN: [id number]  HPI He is here for recheck on his diabetes.  He is now on Trulicity 3 mg as well as Jardiance and Actos plus.  He has made some minor changes in his diet and exercise.  He continues on Lipitor and low-dose lisinopril.  He is also taking allopurinol and having no difficulty with that.  He states that he got mixed results with Cialis and will be trying Viagra to see if that gives better results.  He otherwise has no particular concerns or complaints.   Review of Systems     Objective:    Physical Exam Alert and in no distress.  Hemoglobin A1c is now 7.4       Assessment & Plan:  Diabetes mellitus type 2 with complications (HCC) - Plan: POCT glycosylated hemoglobin (Hb A1C)  Need for vaccination against Streptococcus pneumoniae - Plan: Pneumococcal conjugate vaccine 20-valent (Prevnar 20)  Erectile dysfunction due to arterial insufficiency  Idiopathic gout, unspecified chronicity, unspecified site  Hyperlipidemia associated with type 2 diabetes mellitus (HCC)  Hypertension associated with diabetes (HCC) I encouraged him to continue on his present medication regimen.  He is to let me know whether Viagra tends to work better than Cialis.  Recheck here in 4 months.

## 2023-06-16 ENCOUNTER — Other Ambulatory Visit: Payer: Self-pay

## 2023-06-16 ENCOUNTER — Other Ambulatory Visit (HOSPITAL_COMMUNITY): Payer: Self-pay

## 2023-06-16 DIAGNOSIS — E119 Type 2 diabetes mellitus without complications: Secondary | ICD-10-CM | POA: Diagnosis not present

## 2023-06-17 ENCOUNTER — Other Ambulatory Visit (HOSPITAL_COMMUNITY): Payer: Self-pay

## 2023-06-17 ENCOUNTER — Other Ambulatory Visit: Payer: Self-pay

## 2023-07-08 ENCOUNTER — Encounter (HOSPITAL_COMMUNITY): Payer: Self-pay

## 2023-07-08 ENCOUNTER — Ambulatory Visit (HOSPITAL_COMMUNITY)
Admission: RE | Admit: 2023-07-08 | Discharge: 2023-07-08 | Disposition: A | Discharge status: Home / Self Care | Source: Ambulatory Visit | Attending: Emergency Medicine | Admitting: Emergency Medicine

## 2023-07-08 ENCOUNTER — Ambulatory Visit: Payer: Self-pay

## 2023-07-08 VITALS — BP 115/72 | HR 85 | Temp 98.0°F | Resp 18

## 2023-07-08 DIAGNOSIS — R21 Rash and other nonspecific skin eruption: Secondary | ICD-10-CM

## 2023-07-08 MED ORDER — DOXYCYCLINE HYCLATE 100 MG PO CAPS: 100.0000 mg | ORAL_CAPSULE | Freq: Two times a day (BID) | ORAL | 0 refills | Status: AC

## 2023-07-08 NOTE — Discharge Instructions (Signed)
 We are covering you against Lyme disease with doxycycline  and for a skin infection.  Take this twice daily for the next 7 days.  Take this with food to prevent stomach upset.  This medication can make you more prone to sunburns, so wear additional sun protection.  Consider taking pictures of the wound to track its healing progress.  Return to clinic for any new or urgent symptoms.  Follow-up with your primary care provider if no improvement.

## 2023-07-08 NOTE — ED Provider Notes (Signed)
 [facility]    CSN: [id number] Arrival date & time: 07/08/23  1402      History   Chief Complaint Chief Complaint  Patient presents with   Insect Bite    Appt 1430    HPI Paul Long is a 68 y.o. male.   Patient presents to clinic over concern of a bull's-eye rash to the left hand.  He was doing some yard work outside and he noticed an erythematous area to the left hand.  Today the area has shrunk down more flat but has a bull's-eye appearance.  Had a similar reaction in the past along his right leg that responded well to antibiotics.  Has not had any fevers.  Did not see any ticks or bugs bite him.  The history is provided by the patient and medical records.    Past Medical History:  Diagnosis Date   Allergy    RHINITIS   Diabetes mellitus    Dyslipidemia    Gout    Hyperlipidemia    Hypertension    Obesity    Obstructive sleep apnea (adult) (pediatric) 02/09/2022    Patient Active Problem List   Diagnosis Date Noted   Erectile dysfunction due to arterial insufficiency 01/09/2023   Obstructive sleep apnea (adult) (pediatric) 02/09/2022   Allergic rhinitis 06/02/2021   Gastroesophageal reflux disease without esophagitis 02/28/2019   Gout 09/22/2011   Diabetes mellitus type 2 with complications (HCC) 09/21/2010   Hypertension associated with diabetes (HCC) 09/21/2010   Hyperlipidemia associated with type 2 diabetes mellitus (HCC) 09/21/2010   Obesity (BMI 30-39.9) 09/21/2010    Past Surgical History:  Procedure Laterality Date   CHOLECYSTECTOMY  1986   EYE SURGERY  11/2009   CATARACTS (RIGHT)   EYE SURGERY  11/2010   INGUINAL HERNIA REPAIR Left 1986   INGUINAL HERNIA REPAIR Right 1988   TONSILLECTOMY  1969       Home Medications    Prior to Admission medications   Medication Sig Start Date End Date Taking? Authorizing Provider  allopurinol  (ZYLOPRIM ) 300 MG tablet Take 1 tablet (300 mg total) by mouth daily. 08/10/22  Yes Watson Hacking, MD  atorvastatin  (LIPITOR) 20 MG tablet Take 1 tablet (20 mg total) by mouth daily. 08/10/22  Yes Watson Hacking, MD  Coenzyme Q10 (CO Q 10 PO) Take by mouth.   Yes [provider]  doxycycline  (VIBRAMYCIN ) 100 MG capsule Take 1 capsule (100 mg total) by mouth 2 (two) times daily for 7 days. 07/08/23 07/15/23 Yes Debbie Yearick  N, FNP  Dulaglutide  (TRULICITY ) 3 MG/0.5ML SOAJ Inject 3 mg as directed once a week. 01/10/23  Yes Watson Hacking, MD  empagliflozin  (JARDIANCE ) 25 MG TABS tablet Take 1 tablet (25 mg total) by mouth daily before breakfast. 08/10/22  Yes Watson Hacking, MD  fish oil-omega-3 fatty acids 1000 MG capsule Take 2 g by mouth daily.   Yes [provider]  lisinopril  (ZESTRIL ) 5 MG tablet Take 1 tablet (5 mg total) by mouth daily. 08/10/22  Yes Watson Hacking, MD  MAGNESIUM PO Take by mouth.   Yes [provider]  Multiple Vitamins-Minerals (MULTIVITAMIN WITH MINERALS) tablet Take 1 tablet by mouth daily.   Yes [provider]  pioglitazone -metformin  (ACTOPLUS MET ) 15-850 MG tablet Take 1 tablet by mouth 2 (two) times daily. 04/22/23  Yes Lalonde, John C, MD  Blood Glucose Monitoring Suppl (ONE TOUCH ULTRA 2) w/Device KIT 1 kit by Does not apply route as  directed. 05/18/22   Watson Hacking, MD  fluticasone  (FLONASE ) 50 MCG/ACT nasal spray SPRAY 2 SPRAYS INTO EACH NOSTRIL EVERY DAY 08/12/20   Watson Hacking, MD  glucose blood [facility] ULTRA) test strip Use as instructed 05/18/22   Watson Hacking, MD  Lancets (FREESTYLE) lancets USE AS DIRECTED 05/01/14   Watson Hacking, MD  Lancets [facility] ULTRASOFT) lancets Use as instructed 05/18/22   Watson Hacking, MD  sildenafil  (VIAGRA ) 100 MG tablet Take 1 tablet (100 mg total) by mouth daily as needed for erectile dysfunction. 04/15/23   Lalonde, John C, MD  triamcinolone  ointment (KENALOG ) 0.5 % Apply 1 application. topically 2 (two) times daily. Patient not taking: Reported on 01/10/2023 07/08/21    Watson Hacking, MD    Family History Family History  Problem Relation Age of Onset   Hypertension Mother    Arthritis Mother    Hypertension Father    Stroke Father    Colon cancer Neg Hx     Social History Social History   Tobacco Use   Smoking status: Never   Smokeless tobacco: Never  Vaping Use   Vaping status: Never Used  Substance Use Topics   Alcohol use: No   Drug use: No     Allergies   Patient has no known allergies.   Review of Systems Review of Systems  Per HPI  Physical Exam Triage Vital Signs ED Triage Vitals  Encounter Vitals Group     BP 07/08/23 1431 115/72     Systolic BP Percentile --      Diastolic BP Percentile --      Pulse Rate 07/08/23 1431 85     Resp 07/08/23 1431 18     Temp 07/08/23 1431 98 F (36.7 C)     Temp Source 07/08/23 1431 Oral     SpO2 07/08/23 1431 94 %     Weight --      Height --      Head Circumference --      Peak Flow --      Pain Score 07/08/23 1430 0     Pain Loc --      Pain Education --      Exclude from Growth Chart --    No data found.  Updated Vital Signs BP 115/72 (BP Location: Left Arm)   Pulse 85   Temp 98 F (36.7 C) (Oral)   Resp 18   SpO2 94%   Visual Acuity Right Eye Distance:   Left Eye Distance:   Bilateral Distance:    Right Eye Near:   Left Eye Near:    Bilateral Near:     Physical Exam Vitals and nursing note reviewed.  Constitutional:      Appearance: Normal appearance.  HENT:     Head: Normocephalic and atraumatic.     Right Ear: External ear normal.     Left Ear: External ear normal.     Nose: Nose normal.     Mouth/Throat:     Mouth: Mucous membranes are moist.  Eyes:     Conjunctiva/sclera: Conjunctivae normal.  Cardiovascular:     Rate and Rhythm: Normal rate.  Pulmonary:     Effort: Pulmonary effort is normal. No respiratory distress.  Musculoskeletal:        General: Normal range of motion.  Skin:    General: Skin is warm and dry.     Findings: Rash  present.       Neurological:  General: No focal deficit present.     Mental Status: He is alert.  Psychiatric:        Mood and Affect: Mood normal.      UC Treatments / Results  Labs (all labs ordered are listed, but only abnormal results are displayed) Labs Reviewed - No data to display  EKG   Radiology No results found.  Procedures Procedures (including critical care time)  Medications Ordered in UC Medications - No data to display  Initial Impression / Assessment and Plan / UC Course  I have reviewed the triage vital signs and the nursing notes.  Pertinent labs & imaging results that were available during my care of the patient were reviewed by me and considered in my medical decision making (see chart for details).  Vitals in triage reviewed, patient is hemodynamically stable.  Bull's-eye rash to the left dorsal hand, appears more consistent with bruising.  Due to recent time outside and concern for Lyme versus infection, will cover with doxycycline .  Plan of care, follow-up care return precautions given, no questions at this time.     Final Clinical Impressions(s) / UC Diagnoses   Final diagnoses:  Rash and nonspecific skin eruption     Discharge Instructions      We are covering you against Lyme disease with doxycycline  and for a skin infection.  Take this twice daily for the next 7 days.  Take this with food to prevent stomach upset.  This medication can make you more prone to sunburns, so wear additional sun protection.  Consider taking pictures of the wound to track its healing progress.  Return to clinic for any new or urgent symptoms.  Follow-up with your primary care provider if no improvement.  ED Prescriptions     Medication Sig Dispense Auth. Provider   doxycycline  (VIBRAMYCIN ) 100 MG capsule Take 1 capsule (100 mg total) by mouth 2 (two) times daily for 7 days. 14 capsule Harlow Lighter, Dima Ferrufino  N, FNP      PDMP not reviewed this encounter.    Harlow Lighter Corayma Cashatt  N, FNP 07/08/23 1457

## 2023-07-08 NOTE — Telephone Encounter (Signed)
  Chief Complaint: rash Symptoms: target like rash on L hand Frequency: x few days Pertinent Negatives: Patient denies fever, itching, pain Disposition: [] ED /[x] Urgent Care (no appt availability in office) / [] Appointment(In office/virtual)/ []  [facility]/ [] Home Care/ [] Refused Recommended Disposition /[] [facility]/ []  Follow-up with PCP Additional Notes: Pt calling with target like rash on L hand. Pt reports getting some kind of bite on site that started as a "red splotch" and then worsened and evolved into a target like rash. Pt has concerns for it being a tick bite, but did not find a tic. Pt also inquiring about Lyme Disease. Scheduled patient per protocol on Jul 08, 2023 at [facility]. Pt verbalized understanding and to call back with worsening symptoms.         Copied from CRM [id number]. Topic: Clinical - Red Word Triage >> Jul 08, 2023 11:40 AM El Gravely T wrote: [facility] that prompted transfer to Nurse Triage: Tic bite, red rash Reason for Disposition  Bite starts to look bad (e.g., blister, purplish skin, ulcer)  (Exception: There is just minor swelling or small red bump.)  Answer Assessment - Initial Assessment Questions 1. TYPE of INSECT: "What type of insect was it?"      unsure 2. ONSET: "When did you get bitten?"      Monday  3. LOCATION: "Where is the insect bite located?"      L hand - started noticing "red splotch" then evolved into "target" rash a few days ago 4. REDNESS: "Is the area red or pink?" If Yes, ask: "What size is area of redness?" (inches or cm). "When did the redness start?"     redness 5. PAIN: "Is there any pain?" If Yes, ask: "How bad is it?"  (Scale 1-10; or mild, moderate, severe)     Denies  6. ITCHING: "Does it itch?" If Yes, ask: "How bad is the itch?"    - MILD: doesn't interfere with normal activities   - MODERATE-SEVERE: interferes with work, school, sleep, or other activities      denies 7. SWELLING: "How big is the  swelling?" (inches, cm, or compare to coins)     denies 8. OTHER SYMPTOMS: "Do you have any other symptoms?"  (e.g., difficulty breathing, hives)     denies  Protocols used: Insect Bite-A-AH

## 2023-07-08 NOTE — ED Triage Notes (Signed)
 Pt states that he has a insect bullseye bite n his lefthand X 1 week. He denies any pain. States he used neosporin on the area this morning.

## 2023-07-12 ENCOUNTER — Ambulatory Visit (INDEPENDENT_AMBULATORY_CARE_PROVIDER_SITE_OTHER): Payer: Medicare Other

## 2023-07-12 DIAGNOSIS — Z0000 Encounter for general adult medical examination without abnormal findings: Secondary | ICD-10-CM | POA: Diagnosis not present

## 2023-07-12 NOTE — Progress Notes (Signed)
 Subjective:   Paul Long is a 68 y.o. who presents for a Medicare Wellness preventive visit.  As a reminder, Annual Wellness Visits don't include a physical exam, and some assessments may be limited, especially if this visit is performed virtually. We may recommend an in-person follow-up visit with your provider if needed.  Visit Complete: Virtual I connected with  Paul Long on 07/12/23 by a audio enabled telemedicine application and verified that I am speaking with the correct person using two identifiers.  Patient Location: Home  Provider Location: Office/Clinic  I discussed the limitations of evaluation and management by telemedicine. The patient expressed understanding and agreed to proceed.  Vital Signs: Because this visit was a virtual/telehealth visit, some criteria may be missing or patient reported. Any vitals not documented were not able to be obtained and vitals that have been documented are patient reported.  VideoError- Librarian, academic were attempted between this provider and patient, however failed, due to patient having technical difficulties OR patient did not have access to video capability.  We continued and completed visit with audio only.   Persons Participating in Visit: Patient.  AWV Questionnaire: Yes: Patient Medicare AWV questionnaire was completed by the patient on 07/05/2023; I have confirmed that all information answered by patient is correct and no changes since this date.  Cardiac Risk Factors include: advanced age (>51men, >32 women);diabetes mellitus;dyslipidemia;hypertension;male gender     Objective:     Today's Vitals   There is no height or weight on file to calculate BMI.     07/12/2023   11:30 AM 07/06/2022   11:35 AM 06/09/2021    3:57 PM 05/22/2020    8:28 AM 11/28/2015    8:01 AM  Advanced Directives  Does Patient Have a Medical Advance Directive? Yes Yes Yes Yes Yes  Type of Special educational needs teacher of [place];Living will Healthcare Power of [place];Living will Living will Living will Living will;Healthcare Power of Attorney  Does patient want to make changes to medical advance directive?   No - Patient declined    Copy of Healthcare Power of Attorney in Chart? No - copy requested No - copy requested       Current Medications (verified) Outpatient Encounter Medications as of 07/12/2023  Medication Sig   allopurinol  (ZYLOPRIM ) 300 MG tablet Take 1 tablet (300 mg total) by mouth daily.   atorvastatin  (LIPITOR) 20 MG tablet Take 1 tablet (20 mg total) by mouth daily.   Blood Glucose Monitoring Suppl (ONE TOUCH ULTRA 2) w/Device KIT 1 kit by Does not apply route as directed.   Coenzyme Q10 (CO Q 10 PO) Take by mouth.   doxycycline  (VIBRAMYCIN ) 100 MG capsule Take 1 capsule (100 mg total) by mouth 2 (two) times daily for 7 days.   Dulaglutide  (TRULICITY ) 3 MG/0.5ML SOAJ Inject 3 mg as directed once a week.   empagliflozin  (JARDIANCE ) 25 MG TABS tablet Take 1 tablet (25 mg total) by mouth daily before breakfast.   fish oil-omega-3 fatty acids 1000 MG capsule Take 2 g by mouth daily.   fluticasone  (FLONASE ) 50 MCG/ACT nasal spray SPRAY 2 SPRAYS INTO EACH NOSTRIL EVERY DAY   glucose blood (ONETOUCH ULTRA) test strip Use as instructed   Lancets (FREESTYLE) lancets USE AS DIRECTED   Lancets (ONETOUCH ULTRASOFT) lancets Use as instructed   lisinopril  (ZESTRIL ) 5 MG tablet Take 1 tablet (5 mg total) by mouth daily.   MAGNESIUM PO Take by mouth.   Multiple Vitamins-Minerals (  MULTIVITAMIN WITH MINERALS) tablet Take 1 tablet by mouth daily.   pioglitazone -metformin  (ACTOPLUS MET ) 15-850 MG tablet Take 1 tablet by mouth 2 (two) times daily.   sildenafil  (VIAGRA ) 100 MG tablet Take 1 tablet (100 mg total) by mouth daily as needed for erectile dysfunction.   triamcinolone  ointment (KENALOG ) 0.5 % Apply 1 application. topically 2 (two) times daily. (Patient not taking: Reported on  07/12/2023)   Facility-Administered Encounter Medications as of 07/12/2023  Medication   0.9 %  sodium chloride  infusion    Allergies (verified) Patient has no known allergies.   History: Past Medical History:  Diagnosis Date   Allergy    RHINITIS   Diabetes mellitus    Dyslipidemia    Gout    Hyperlipidemia    Hypertension    Obesity    Obstructive sleep apnea (adult) (pediatric) 02/09/2022   Past Surgical History:  Procedure Laterality Date   CHOLECYSTECTOMY  1986   EYE SURGERY  11/2009   CATARACTS (RIGHT)   EYE SURGERY  11/2010   INGUINAL HERNIA REPAIR Left 1986   INGUINAL HERNIA REPAIR Right 1988   TONSILLECTOMY  1969   Family History  Problem Relation Age of Onset   Hypertension Mother    Arthritis Mother    Hypertension Father    Stroke Father    Colon cancer Neg Hx    Social History   Socioeconomic History   Marital status: Widowed    Spouse name: Not on file   Number of children: Not on file   Years of education: Not on file   Highest education level: Associate degree: occupational, Scientist, product/process development, or vocational program  Occupational History   Not on file  Tobacco Use   Smoking status: Never   Smokeless tobacco: Never  Vaping Use   Vaping status: Never Used  Substance and Sexual Activity   Alcohol use: No   Drug use: No   Sexual activity: Not Currently  Other Topics Concern   Not on file  Social History Narrative   Not on file   Social Drivers of Health   Financial Resource Strain: Low Risk  (07/05/2023)   Overall Financial Resource Strain (CARDIA)    Difficulty of Paying Living Expenses: Not hard at all  Food Insecurity: No Food Insecurity (07/05/2023)   Hunger Vital Sign    Worried About Running Out of Food in the Last Year: Never true    Ran Out of Food in the Last Year: Never true  Transportation Needs: No Transportation Needs (07/05/2023)   PRAPARE - Administrator, Civil Service (Medical): No    Lack of Transportation  (Non-Medical): No  Physical Activity: Sufficiently Active (07/05/2023)   Exercise Vital Sign    Days of Exercise per Week: 7 days    Minutes of Exercise per Session: 30 min  Stress: No Stress Concern Present (07/05/2023)   [employer] of Occupational Health - Occupational Stress Questionnaire    Feeling of Stress : Not at all  Social Connections: Moderately Isolated (07/05/2023)   Social Connection and Isolation Panel [NHANES]    Frequency of Communication with Friends and Family: More than three times a week    Frequency of Social Gatherings with Friends and Family: More than three times a week    Attends Religious Services: 1 to 4 times per year    Active Member of [employer] or Organizations: No    Attends Banker Meetings: Not on file    Marital Status: Widowed  Tobacco Counseling Counseling given: Not Answered    Clinical Intake:  Pre-visit preparation completed: Yes  Pain : No/denies pain     Nutritional Risks: None Diabetes: Yes CBG done?: No Did pt. bring in CBG monitor from home?: No  Lab Results  Component Value Date   HGBA1C 7.4 (A) 05/24/2023   HGBA1C 8.0 (A) 01/10/2023   HGBA1C 6.9 (A) 08/10/2022     How often do you need to have someone help you when you read instructions, pamphlets, or other written materials from your doctor or pharmacy?: 1 - Never  Interpreter Needed?: No  Information entered by :: NAllen LPN   Activities of Daily Living     07/05/2023    9:03 AM  In your present state of health, do you have any difficulty performing the following activities:  Hearing? 0  Vision? 0  Difficulty concentrating or making decisions? 0  Walking or climbing stairs? 0  Dressing or bathing? 0  Doing errands, shopping? 0  Preparing Food and eating ? N  Using the Toilet? N  In the past six months, have you accidently leaked urine? N  Do you have problems with loss of bowel control? N  Managing your Medications? N  Managing your  Finances? N  Housekeeping or managing your Housekeeping? N    Patient Care Team: Watson Hacking, MD as PCP - General (Family Medicine)  Indicate any recent Medical Services you may have received from other than [facility] providers in the past year (date may be approximate).     Assessment:    This is a routine wellness examination for Yacob.  Hearing/Vision screen Hearing Screening - Comments:: Denies hearing issues Vision Screening - Comments:: Regular eye exams, [facility]   Goals Addressed             This Visit's Progress    Patient Stated       07/12/2023, wants to lose weight and get blood sugar down       Depression Screen     07/12/2023   11:31 AM 01/10/2023    3:03 PM 07/06/2022   11:36 AM 06/09/2021    3:58 PM 05/25/2021    1:21 PM 05/22/2020    8:29 AM 09/22/2018    8:19 AM  PHQ 2/9 Scores  PHQ - 2 Score 0 0 0 0 0 0 0  PHQ- 9 Score 0  0        Fall Risk     07/05/2023    9:03 AM 01/10/2023    3:03 PM 07/06/2022   11:36 AM 04/13/2022    9:35 AM 06/09/2021    3:58 PM  Fall Risk   Falls in the past year? 0 0 0 0 0  Number falls in past yr: 0 0 0 0 0  Injury with Fall? 0 0 0 0 0  Risk for fall due to : Medication side effect No Fall Risks Medication side effect No Fall Risks No Fall Risks  Follow up Falls prevention discussed;Falls evaluation completed Falls evaluation completed Falls prevention discussed;Education provided;Falls evaluation completed Falls evaluation completed Falls evaluation completed    MEDICARE RISK AT HOME:  Medicare Risk at Home Any stairs in or around the home?: (Patient-Rptd) No If so, are there any without handrails?: (Patient-Rptd) No Home free of loose throw rugs in walkways, pet beds, electrical cords, etc?: (Patient-Rptd) Yes Adequate lighting in your home to reduce risk of falls?: (Patient-Rptd) Yes Life alert?: (Patient-Rptd) No Use of a cane, walker  or w/c?: (Patient-Rptd) No Grab bars in the bathroom?: (Patient-Rptd)  No Shower chair or bench in shower?: (Patient-Rptd) No Elevated toilet seat or a handicapped toilet?: (Patient-Rptd) No  TIMED UP AND GO:  Was the test performed?  No  Cognitive Function: 6CIT completed        07/12/2023   11:32 AM 07/06/2022   11:38 AM 06/09/2021    4:00 PM  6CIT Screen  What Year? 0 points 0 points 0 points  What month? 0 points 0 points 0 points  What time? 0 points 0 points 0 points  Count back from 20 0 points 0 points 0 points  Months in reverse 0 points 0 points 0 points  Repeat phrase 0 points 2 points 0 points  Total Score 0 points 2 points 0 points    Immunizations Immunization History  Administered Date(s) Administered   Fluad Quad(high Dose 65+) 12/09/2020   Fluad Trivalent(High Dose 65+) 10/19/2022   Influenza Split 11/30/2010, 12/03/2011   Influenza Whole 12/02/2006, 11/03/2007, 11/11/2009   Influenza,inj,Quad PF,6+ Mos 12/08/2012, 10/31/2013, 11/19/2014, 12/05/2015, 11/04/2016, 11/24/2017, 10/24/2018, 12/19/2019, 10/05/2021   PFIZER(Purple Top)SARS-COV-2 Vaccination 04/30/2019, 05/21/2019, 12/19/2019   PNEUMOCOCCAL CONJUGATE-20 05/24/2023   Pfizer Covid-19 Vaccine Bivalent Booster 65yrs & up 01/26/2021   Pfizer(Comirnaty)Fall Seasonal Vaccine 12 years and older 12/09/2021, 01/10/2023   Pneumococcal Conjugate-13 10/31/2013   Pneumococcal Polysaccharide-23 05/02/2015   Tdap 09/24/2009, 02/28/2019   Zoster Recombinant(Shingrix ) 05/11/2016, 11/04/2016   Zoster, Live 05/02/2015    Screening Tests Health Maintenance  Topic Date Due   COVID-19 Vaccine (7 - Pfizer risk 2024-25 season) 07/10/2023   Diabetic kidney evaluation - eGFR measurement  08/10/2023   Diabetic kidney evaluation - Urine ACR  08/10/2023   OPHTHALMOLOGY EXAM  07/23/2023   FOOT EXAM  08/10/2023   INFLUENZA VACCINE  09/16/2023   HEMOGLOBIN A1C  11/23/2023   Medicare Annual Wellness (AWV)  07/11/2024   Colonoscopy  12/11/2025   DTaP/Tdap/Td (3 - Td or Tdap) 02/27/2029    Hepatitis C Screening  Completed   Zoster Vaccines- Shingrix   Completed   HPV VACCINES  Aged Out   Meningococcal B Vaccine  Aged Out   Pneumonia Vaccine 41+ Years old  Discontinued    Health Maintenance  Health Maintenance Due  Topic Date Due   COVID-19 Vaccine (7 - Pfizer risk 2024-25 season) 07/10/2023   Diabetic kidney evaluation - eGFR measurement  08/10/2023   Diabetic kidney evaluation - Urine ACR  08/10/2023   Health Maintenance Items Addressed: Kidney Eval to be done at appointment in August  Additional Screening:  Vision Screening: Recommended annual ophthalmology exams for early detection of glaucoma and other disorders of the eye.  Dental Screening: Recommended annual dental exams for proper oral hygiene  Community Resource Referral / Chronic Care Management: CRR required this visit?  No   CCM required this visit?  No   Plan:    I have personally reviewed and noted the following in the patient's chart:   Medical and social history Use of alcohol, tobacco or illicit drugs  Current medications and supplements including opioid prescriptions. Patient is not currently taking opioid prescriptions. Functional ability and status Nutritional status Physical activity Advanced directives List of other physicians Hospitalizations, surgeries, and ER visits in previous 12 months Vitals Screenings to include cognitive, depression, and falls Referrals and appointments  In addition, I have reviewed and discussed with patient certain preventive protocols, quality metrics, and best practice recommendations. A written personalized care plan for preventive services as well as  general preventive health recommendations were provided to patient.   Areatha Beecham, LPN   1/61/0960   After Visit Summary: (MyChart) Due to this being a telephonic visit, the after visit summary with patients personalized plan was offered to patient via MyChart   Notes: Nothing significant to  report at this time.

## 2023-07-12 NOTE — Patient Instructions (Signed)
 [name] , Thank you for taking time out of your busy schedule to complete your Annual Wellness Visit with me. I enjoyed our conversation and look forward to speaking with you again next year. I, as well as your care team,  appreciate your ongoing commitment to your health goals. Please review the following plan we discussed and let me know if I can assist you in the future. Your Game plan/ To Do List    Referrals: If you haven't heard from the office you've been referred to, please reach out to them at the phone provided.  N/a Follow up Visits: Next Medicare AWV with our clinical staff: 07/24/2024 at 11:30   Have you seen your provider in the last 6 months (3 months if uncontrolled diabetes)? Yes Next Office Visit with your provider: 09/28/2023 at 10:00  Clinician Recommendations:  Aim for 30 minutes of exercise or brisk walking, 6-8 glasses of water, and 5 servings of fruits and vegetables each day.       This is a list of the screening recommended for you and due dates:  Health Maintenance  Topic Date Due   COVID-19 Vaccine (7 - Pfizer risk 2024-25 season) 07/10/2023   Yearly kidney function blood test for diabetes  08/10/2023   Yearly kidney health urinalysis for diabetes  08/10/2023   Eye exam for diabetics  07/23/2023   Complete foot exam   08/10/2023   Flu Shot  09/16/2023   Hemoglobin A1C  11/23/2023   Medicare Annual Wellness Visit  07/11/2024   Colon Cancer Screening  12/11/2025   DTaP/Tdap/Td vaccine (3 - Td or Tdap) 02/27/2029   Hepatitis C Screening  Completed   Zoster (Shingles) Vaccine  Completed   HPV Vaccine  Aged Out   Meningitis B Vaccine  Aged Out   Pneumonia Vaccine  Discontinued    Advanced directives: (Copy Requested) Please bring a copy of your health care power of attorney and living will to the office to be added to your chart at your convenience. You can mail to [facility] [address] [place], [place] [address] or email to  [email or web address] Advance Care Planning is important because it:  [x]  Makes sure you receive the medical care that is consistent with your values, goals, and preferences  [x]  It provides guidance to your family and loved ones and reduces their decisional burden about whether or not they are making the right decisions based on your wishes.  Follow the link provided in your after visit summary or read over the paperwork we have mailed to you to help you started getting your Advance Directives in place. If you need assistance in completing these, please reach out to us  so that we can help you!  See attachments for Preventive Care and Fall Prevention Tips.

## 2023-07-14 ENCOUNTER — Other Ambulatory Visit: Payer: Self-pay | Admitting: Family Medicine

## 2023-07-14 DIAGNOSIS — E118 Type 2 diabetes mellitus with unspecified complications: Secondary | ICD-10-CM

## 2023-07-15 ENCOUNTER — Other Ambulatory Visit (HOSPITAL_COMMUNITY): Payer: Self-pay

## 2023-07-15 MED ORDER — PIOGLITAZONE HCL-METFORMIN HCL 15-850 MG PO TABS
1.0000 | ORAL_TABLET | Freq: Two times a day (BID) | ORAL | 0 refills | Status: DC
  Filled 2023-07-15: qty 180, 90d supply, fill #0

## 2023-07-17 DIAGNOSIS — E119 Type 2 diabetes mellitus without complications: Secondary | ICD-10-CM | POA: Diagnosis not present

## 2023-07-18 ENCOUNTER — Other Ambulatory Visit (HOSPITAL_COMMUNITY): Payer: Self-pay

## 2023-07-19 DIAGNOSIS — G4733 Obstructive sleep apnea (adult) (pediatric): Secondary | ICD-10-CM | POA: Diagnosis not present

## 2023-07-21 ENCOUNTER — Other Ambulatory Visit: Payer: Self-pay | Admitting: Family Medicine

## 2023-08-16 DIAGNOSIS — E119 Type 2 diabetes mellitus without complications: Secondary | ICD-10-CM | POA: Diagnosis not present

## 2023-08-22 DIAGNOSIS — K080 Exfoliation of teeth due to systemic causes: Secondary | ICD-10-CM | POA: Diagnosis not present

## 2023-08-23 ENCOUNTER — Other Ambulatory Visit: Payer: Self-pay | Admitting: Family Medicine

## 2023-08-23 ENCOUNTER — Telehealth: Payer: Self-pay

## 2023-08-23 DIAGNOSIS — E118 Type 2 diabetes mellitus with unspecified complications: Secondary | ICD-10-CM

## 2023-08-23 MED ORDER — JARDIANCE 25 MG PO TABS: 25.0000 mg | ORAL_TABLET | Freq: Every day | ORAL | 1 refills | Status: DC

## 2023-08-23 NOTE — Addendum Note (Signed)
 Addended by: JOYCE NORLEEN BROCKS on: 08/23/2023 05:15 PM   Modules accepted: Orders

## 2023-08-23 NOTE — Telephone Encounter (Signed)
 Copied from CRM [id number]. Topic: Clinical - Medication Question >> Aug 23, 2023  9:49 AM Gustabo D wrote: Pharmacy is requesting a verbal scrip for -empagliflozin  (JARDIANCE ) 25 MG TABS tablet JoJo from Pharmacy  -[phone]

## 2023-08-24 ENCOUNTER — Other Ambulatory Visit: Payer: Self-pay | Admitting: Family Medicine

## 2023-08-24 DIAGNOSIS — M1000 Idiopathic gout, unspecified site: Secondary | ICD-10-CM

## 2023-08-24 DIAGNOSIS — E1169 Type 2 diabetes mellitus with other specified complication: Secondary | ICD-10-CM

## 2023-09-14 DIAGNOSIS — H5213 Myopia, bilateral: Secondary | ICD-10-CM | POA: Diagnosis not present

## 2023-09-14 LAB — HM DIABETES EYE EXAM

## 2023-09-16 DIAGNOSIS — E119 Type 2 diabetes mellitus without complications: Secondary | ICD-10-CM | POA: Diagnosis not present

## 2023-09-20 NOTE — Procedures (Signed)
 SABRA

## 2023-09-28 ENCOUNTER — Ambulatory Visit: Admitting: Family Medicine

## 2023-09-28 ENCOUNTER — Encounter: Payer: Self-pay | Admitting: Family Medicine

## 2023-09-28 VITALS — BP 124/68 | HR 80 | Ht 70.5 in | Wt 204.2 lb

## 2023-09-28 DIAGNOSIS — G4733 Obstructive sleep apnea (adult) (pediatric): Secondary | ICD-10-CM

## 2023-09-28 DIAGNOSIS — J301 Allergic rhinitis due to pollen: Secondary | ICD-10-CM

## 2023-09-28 DIAGNOSIS — M1000 Idiopathic gout, unspecified site: Secondary | ICD-10-CM

## 2023-09-28 DIAGNOSIS — E1169 Type 2 diabetes mellitus with other specified complication: Secondary | ICD-10-CM | POA: Diagnosis not present

## 2023-09-28 DIAGNOSIS — E118 Type 2 diabetes mellitus with unspecified complications: Secondary | ICD-10-CM | POA: Diagnosis not present

## 2023-09-28 DIAGNOSIS — N5201 Erectile dysfunction due to arterial insufficiency: Secondary | ICD-10-CM

## 2023-09-28 DIAGNOSIS — E669 Obesity, unspecified: Secondary | ICD-10-CM | POA: Diagnosis not present

## 2023-09-28 DIAGNOSIS — E1159 Type 2 diabetes mellitus with other circulatory complications: Secondary | ICD-10-CM | POA: Diagnosis not present

## 2023-09-28 DIAGNOSIS — I152 Hypertension secondary to endocrine disorders: Secondary | ICD-10-CM | POA: Diagnosis not present

## 2023-09-28 DIAGNOSIS — E785 Hyperlipidemia, unspecified: Secondary | ICD-10-CM

## 2023-09-28 DIAGNOSIS — K219 Gastro-esophageal reflux disease without esophagitis: Secondary | ICD-10-CM

## 2023-09-28 LAB — COMPREHENSIVE METABOLIC PANEL WITH GFR: eGFR: 96

## 2023-09-28 LAB — POCT GLYCOSYLATED HEMOGLOBIN (HGB A1C): Hemoglobin A1C: 6.6 % — AB (ref 4.0–5.6)

## 2023-09-28 MED ORDER — TRULICITY 3 MG/0.5ML ~~LOC~~ SOAJ: 3.0000 mg | SUBCUTANEOUS | 1 refills | Status: DC

## 2023-09-28 MED ORDER — LISINOPRIL 5 MG PO TABS
5.0000 mg | ORAL_TABLET | Freq: Every day | ORAL | 3 refills | Status: AC
Start: 2023-09-28 — End: ?

## 2023-09-28 MED ORDER — PIOGLITAZONE HCL-METFORMIN HCL 15-850 MG PO TABS: 1.0000 | ORAL_TABLET | Freq: Two times a day (BID) | ORAL | 0 refills | Status: AC

## 2023-09-28 NOTE — Progress Notes (Signed)
 Subjective:    Patient ID: Paul Long, male    DOB: 08/09/1955, 67 y.o.   MRN: [id number]  Paul Long is a 68 y.o. male who presents for follow-up of Type 2 diabetes mellitus.  The following portions of the patient's history were reviewed and updated as appropriate: allergies, current medications, past medical history, past social history and problem list. Discussed the use of AI scribe software for clinical note transcription with the patient, who gave verbal consent to proceed.    Be manages his diabetes with Jardiance , Actos , and Trulicity . Blood sugar levels range from 100 to 160 mg/dL, influenced by diet. His last A1c was 6.6, improved from 7.4. No issues with these medications. Home blood sugar records: average around 150-160 Current symptoms/problems include occasionally feet itch and have been stable. Daily foot checks: no   Any foot concerns: itching but is rare How often blood sugars checked: He takes atorvastatin  for cholesterol and allopurinol  for gout. He describes his gout as 'heavy on the ouch' but has no current issues. He switched from Cialis  to Viagra  for erectile dysfunction, which is effective.  He uses a CPAP machine for sleep apnea, reporting significant improvement in sleep quality, rarely waking at night, and feeling less tired during the day.  He takes Prilosec for reflux, using it once in the morning with good effect. He has not reduced the frequency of use.  He experiences occasional itching on the bottom of his feet lasting a few minutes. He notes slight weight loss since his last visit. No issues with smoking or drinking.  He is currently dating and has concerns about the financial implications of marriage due to his widow status and social security benefits. He receives a monthly retirement benefit from the state of [place] , which he would lose if he remarries.            ROS as in subjective above.     Objective:    Physical  Exam Alert and in no distress foot exam is normal Blood pressure 124/68, pulse 80, height 5' 10.5 (1.791 m), weight 204 lb 3.2 oz (92.6 kg), SpO2 98%. Hemoglobin hemoglobin A1c is 6.6 Lab Review    Latest Ref Rng & Units 09/28/2023   10:28 AM 05/24/2023   11:21 AM 01/10/2023    3:35 PM 08/10/2022   10:54 AM 08/10/2022   10:40 AM  Diabetic Labs  HbA1c 4.0 - 5.6 % 6.6  7.4  8.0   6.9   Chol 100 - 199 mg/dL    893    HDL >60 mg/dL    40    Calc LDL 0 - 99 mg/dL    41    Triglycerides 0 - 149 mg/dL    857    Creatinine 9.23 - 1.27 mg/dL    9.17        1/86/7974    9:58 AM 07/12/2023   11:25 AM 07/08/2023    2:31 PM 05/24/2023   10:11 AM 01/10/2023    3:03 PM  BP/Weight  Systolic BP 124 -- 115 115 110  Diastolic BP 68 -- 72 78 68  Wt. (Lbs) 204.2 --  206.6 212  BMI 28.89 kg/m2   29.23 kg/m2 29.99 kg/m2      Latest Ref Rng & Units 09/28/2023   10:00 AM 09/14/2023   12:35 PM  Foot/eye exam completion dates  Eye Exam No Retinopathy  No Retinopathy      Foot Form Completion  Done  This result is from an external source.    Paul Long  reports that he has never smoked. He has never used smokeless tobacco. He reports that he does not drink alcohol and does not use drugs.     Assessment & Plan:    Diabetes mellitus type 2 with complications (HCC) - Plan: CBC with Differential/Platelet, Comprehensive metabolic panel with GFR, Lipid panel, POCT glycosylated hemoglobin (Hb A1C), Dulaglutide  (TRULICITY ) 3 MG/0.5ML SOAJ, pioglitazone -metformin  (ACTOPLUS MET ) 15-850 MG tablet, CANCELED: POCT UA - Microalbumin  Hyperlipidemia associated with type 2 diabetes mellitus (HCC) - Plan: Lipid panel  Hypertension associated with diabetes (HCC) - Plan: CBC with Differential/Platelet, Comprehensive metabolic panel with GFR, lisinopril  (ZESTRIL ) 5 MG tablet  Obesity (BMI 30-39.9) - Plan: CBC with Differential/Platelet, Comprehensive metabolic panel with GFR, Lipid panel  OSA on CPAP  Idiopathic  gout, unspecified chronicity, unspecified site - Plan: Uric Acid  Gastroesophageal reflux disease without esophagitis  Erectile dysfunction due to arterial insufficiency  Non-seasonal allergic rhinitis due to pollen Continue on present medication regimen.  I congratulated him on lowering his A1c. Patient left before leaving a urine specimen.

## 2023-09-29 ENCOUNTER — Ambulatory Visit: Payer: Self-pay | Admitting: Family Medicine

## 2023-09-29 LAB — LIPID PANEL
Chol/HDL Ratio: 2.7 ratio (ref 0.0–5.0)
Cholesterol, Total: 107 mg/dL (ref 100–199)
HDL: 40 mg/dL (ref >39)
LDL Chol Calc (NIH): 44 mg/dL (ref 0–99)
Triglycerides: 131 mg/dL (ref 0–149)
VLDL Cholesterol Cal: 23 mg/dL (ref 5–40)

## 2023-09-29 LAB — CBC WITH DIFFERENTIAL/PLATELET
Basophils Absolute: 0.1 x10E3/uL (ref 0.0–0.2)
Basos: 1 %
EOS (ABSOLUTE): 0.2 x10E3/uL (ref 0.0–0.4)
Eos: 2 %
Hematocrit: 48.5 % (ref 37.5–51.0)
Hemoglobin: 16 g/dL (ref 13.0–17.7)
Immature Grans (Abs): 0 x10E3/uL (ref 0.0–0.1)
Immature Granulocytes: 0 %
Lymphocytes Absolute: 2.2 x10E3/uL (ref 0.7–3.1)
Lymphs: 31 %
MCH: 32.2 pg (ref 26.6–33.0)
MCHC: 33 g/dL (ref 31.5–35.7)
MCV: 98 fL — ABNORMAL HIGH (ref 79–97)
Monocytes Absolute: 0.4 x10E3/uL (ref 0.1–0.9)
Monocytes: 6 %
Neutrophils Absolute: 4.5 x10E3/uL (ref 1.4–7.0)
Neutrophils: 60 %
Platelets: 228 x10E3/uL (ref 150–450)
RBC: 4.97 x10E6/uL (ref 4.14–5.80)
RDW: 13.2 % (ref 11.6–15.4)
WBC: 7.3 x10E3/uL (ref 3.4–10.8)

## 2023-09-29 LAB — COMPREHENSIVE METABOLIC PANEL WITH GFR
ALT: 32 IU/L (ref 0–44)
AST: 29 IU/L (ref 0–40)
Albumin: 4.7 g/dL (ref 3.9–4.9)
Alkaline Phosphatase: 114 IU/L (ref 44–121)
BUN/Creatinine Ratio: 14 (ref 10–24)
BUN: 11 mg/dL (ref 8–27)
Bilirubin Total: 1.4 mg/dL — ABNORMAL HIGH (ref 0.0–1.2)
CO2: 22 mmol/L (ref 20–29)
Calcium: 9.5 mg/dL (ref 8.6–10.2)
Chloride: 102 mmol/L (ref 96–106)
Creatinine, Ser: 0.81 mg/dL (ref 0.76–1.27)
Globulin, Total: 2.3 g/dL (ref 1.5–4.5)
Glucose: 99 mg/dL (ref 70–99)
Potassium: 4.1 mmol/L (ref 3.5–5.2)
Sodium: 141 mmol/L (ref 134–144)
Total Protein: 7 g/dL (ref 6.0–8.5)
eGFR: 96 mL/min/1.73 (ref >59)

## 2023-09-29 LAB — URIC ACID: Uric Acid: 2.8 mg/dL — ABNORMAL LOW (ref 3.8–8.4)

## 2023-10-07 DIAGNOSIS — G4733 Obstructive sleep apnea (adult) (pediatric): Secondary | ICD-10-CM | POA: Diagnosis not present

## 2023-10-10 ENCOUNTER — Encounter: Payer: Self-pay | Admitting: *Deleted

## 2023-10-12 ENCOUNTER — Other Ambulatory Visit: Payer: Self-pay | Admitting: Family Medicine

## 2023-10-12 NOTE — Telephone Encounter (Signed)
 Is this okay to refill?

## 2023-10-17 DIAGNOSIS — E119 Type 2 diabetes mellitus without complications: Secondary | ICD-10-CM | POA: Diagnosis not present

## 2023-11-16 DIAGNOSIS — E119 Type 2 diabetes mellitus without complications: Secondary | ICD-10-CM | POA: Diagnosis not present

## 2023-11-24 ENCOUNTER — Other Ambulatory Visit

## 2023-11-24 DIAGNOSIS — Z23 Encounter for immunization: Secondary | ICD-10-CM

## 2023-12-01 ENCOUNTER — Other Ambulatory Visit

## 2023-12-01 DIAGNOSIS — Z23 Encounter for immunization: Secondary | ICD-10-CM

## 2023-12-08 ENCOUNTER — Other Ambulatory Visit: Payer: Self-pay | Admitting: Family Medicine

## 2023-12-17 DIAGNOSIS — E119 Type 2 diabetes mellitus without complications: Secondary | ICD-10-CM | POA: Diagnosis not present

## 2024-01-06 ENCOUNTER — Other Ambulatory Visit: Payer: Self-pay | Admitting: Family Medicine

## 2024-01-06 DIAGNOSIS — E118 Type 2 diabetes mellitus with unspecified complications: Secondary | ICD-10-CM

## 2024-01-10 ENCOUNTER — Telehealth: Payer: Self-pay | Admitting: Family Medicine

## 2024-01-10 ENCOUNTER — Telehealth: Payer: Self-pay

## 2024-01-10 NOTE — Telephone Encounter (Signed)
 Filled and faxed provider portion for provider to sign and date,per [facility] Jon pt has already completed pt portion at the office.

## 2024-01-10 NOTE — Telephone Encounter (Signed)
 Patient walked in and says he got a notification from [employer] to refill his medication by the end of the year.  I wasn't sure which one of you helped with this so sending to Laura and Angela.

## 2024-01-10 NOTE — Telephone Encounter (Signed)
 Hey, spoke with patient. He has completed his portion of the Jardiance  and Trulicity  2026 renewal applications. Provider portion for both applications still needs to be completed.   Are you able to prep these and fax over to the office for signing? Prescriber is PCP Dr. Joyce, fax [phone]

## 2024-01-11 NOTE — Progress Notes (Signed)
 Paul Long                                          MRN: [id number]   01/11/2024   The VBCI Quality Team Specialist reviewed this patient medical record for the purposes of chart review for care gap closure. The following were reviewed: chart review for care gap closure-kidney health evaluation for diabetes:eGFR  and uACR.    VBCI Quality Team

## 2024-01-13 NOTE — Telephone Encounter (Signed)
 Received pap [facility] and [facility] back from provider office.

## 2024-01-18 NOTE — Telephone Encounter (Signed)
 Provider portion signed & faxed

## 2024-02-02 ENCOUNTER — Telehealth: Payer: Self-pay

## 2024-02-02 NOTE — Progress Notes (Signed)
° °  02/02/2024  Patient ID: Paul Long, male   DOB: 12-15-1955, 68 y.o.   MRN: [id number]  Patient came by the office and presented letters from [facility] and [facility] stating his applications were both missing information from provider. Review shows application have been completed and were faxed in last month.  Forwarding to CPhT for follow up.  Jon VEAR Lindau, PharmD Clinical Pharmacist [phone]

## 2024-02-03 ENCOUNTER — Telehealth: Payer: Self-pay

## 2024-02-03 NOTE — Telephone Encounter (Signed)
 per [facility] Jon pt submitted pap online, Call [employer] [facility] (Trulicity ) spoke with representative comfirm they received pt portion of application online, but does not have provider portion,faxed provider portion to [facility].

## 2024-02-03 NOTE — Telephone Encounter (Signed)
 Call [facility] pt received a letter from company requesting more imf,spoke with representative explain they have not send any letter with this request,but have not received re-enrollment for 2026 need to faxed again. Call [facility](Jardiance ) spoke with a representative explain they have not receive provider portion ask to faxed again, faxed to [facility], they have received pt portion.

## 2024-02-08 NOTE — Progress Notes (Signed)
" ° °  02/08/2024  Patient ID: [name], male   DOB: March 21, 1955, 68 y.o.   MRN: [id number]  Per [employer] automated system, patient has been APPROVED to continue to receive Trulicity  through 02/14/25.  Received another notice from [facility] stating that application is missing information, routing to CPhT for follow up.  Jon VEAR Lindau, PharmD Clinical Pharmacist [phone]  "

## 2024-02-13 NOTE — Telephone Encounter (Signed)
 Call [employer] received a letter from [employer] stating they are still missing Imf. Spoke with representative explain they have not received provider portion to faxed again.I have faxed to [facility] they ask to check back in 72 hours.

## 2024-02-17 ENCOUNTER — Other Ambulatory Visit (HOSPITAL_COMMUNITY): Payer: Self-pay

## 2024-02-17 NOTE — Progress Notes (Signed)
Approval letter uploaded to media tab

## 2024-02-20 ENCOUNTER — Other Ambulatory Visit: Payer: Self-pay | Admitting: Family Medicine

## 2024-03-22 ENCOUNTER — Telehealth: Payer: Self-pay | Admitting: Internal Medicine

## 2024-03-22 NOTE — Telephone Encounter (Unsigned)
 Copied from CRM [id number]. Topic: Clinical - Prescription Issue >> Mar 22, 2024 10:49 AM Darshell M wrote: Reason for CRM: Melissa calling from [facility] regarding patient assistance application for Trulicity . On page 8 of the from (consent page) the patient's DOB is incorrect. On page 9, the address is in the field where the provider's name should be. Please make corrections and resend form. Melissa [phone]

## 2024-03-22 NOTE — Telephone Encounter (Signed)
 Forms corrected and have been faxed over to [facility].  Ana- can you follow up with [facility] tomorrow and make sure they have everything they need for this patient's trulicity  renewa application?

## 2024-03-23 ENCOUNTER — Other Ambulatory Visit: Payer: Self-pay | Admitting: Family Medicine

## 2024-03-23 DIAGNOSIS — E118 Type 2 diabetes mellitus with unspecified complications: Secondary | ICD-10-CM

## 2024-04-04 ENCOUNTER — Ambulatory Visit: Payer: Self-pay | Admitting: Family Medicine

## 2024-07-24 ENCOUNTER — Ambulatory Visit: Payer: Self-pay
